# Patient Record
Sex: Male | Born: 1955 | Race: White | Hispanic: No | Marital: Married | Smoking: Never smoker
Health system: Southern US, Community
[De-identification: ages and names within clinical notes are randomized; demographics above are authoritative.]

## PROBLEM LIST (undated history)

## (undated) DIAGNOSIS — T7840XA Allergy, unspecified, initial encounter: Secondary | ICD-10-CM

## (undated) DIAGNOSIS — R972 Elevated prostate specific antigen [PSA]: Secondary | ICD-10-CM

## (undated) DIAGNOSIS — R7303 Prediabetes: Secondary | ICD-10-CM

## (undated) DIAGNOSIS — N4340 Spermatocele of epididymis, unspecified: Secondary | ICD-10-CM

## (undated) DIAGNOSIS — N400 Enlarged prostate without lower urinary tract symptoms: Secondary | ICD-10-CM

## (undated) DIAGNOSIS — T8859XA Other complications of anesthesia, initial encounter: Secondary | ICD-10-CM

## (undated) DIAGNOSIS — I10 Essential (primary) hypertension: Secondary | ICD-10-CM

## (undated) DIAGNOSIS — N529 Male erectile dysfunction, unspecified: Secondary | ICD-10-CM

## (undated) HISTORY — DX: Elevated prostate specific antigen (PSA): R97.20

## (undated) HISTORY — DX: Allergy, unspecified, initial encounter: T78.40XA

## (undated) HISTORY — PX: COLONOSCOPY: SHX174

## (undated) HISTORY — DX: Spermatocele of epididymis, unspecified: N43.40

## (undated) HISTORY — DX: Male erectile dysfunction, unspecified: N52.9

---

## 2013-07-18 ENCOUNTER — Other Ambulatory Visit: Payer: Self-pay

## 2013-07-18 ENCOUNTER — Ambulatory Visit (INDEPENDENT_AMBULATORY_CARE_PROVIDER_SITE_OTHER): Payer: BC Managed Care – PPO | Admitting: Family Medicine

## 2013-07-18 VITALS — BP 130/80 | HR 79 | Temp 98.0°F | Resp 17 | Ht 73.0 in | Wt 203.0 lb

## 2013-07-18 DIAGNOSIS — N529 Male erectile dysfunction, unspecified: Secondary | ICD-10-CM | POA: Insufficient documentation

## 2013-07-18 DIAGNOSIS — N5089 Other specified disorders of the male genital organs: Secondary | ICD-10-CM

## 2013-07-18 DIAGNOSIS — Z0000 Encounter for general adult medical examination without abnormal findings: Secondary | ICD-10-CM

## 2013-07-18 DIAGNOSIS — N508 Other specified disorders of male genital organs: Secondary | ICD-10-CM

## 2013-07-18 DIAGNOSIS — E785 Hyperlipidemia, unspecified: Secondary | ICD-10-CM

## 2013-07-18 DIAGNOSIS — R5381 Other malaise: Secondary | ICD-10-CM

## 2013-07-18 DIAGNOSIS — R972 Elevated prostate specific antigen [PSA]: Secondary | ICD-10-CM

## 2013-07-18 LAB — COMPREHENSIVE METABOLIC PANEL
ALT: 37 U/L (ref 0–53)
CO2: 24 mEq/L (ref 19–32)
Calcium: 9.1 mg/dL (ref 8.4–10.5)
Chloride: 101 mEq/L (ref 96–112)
Creat: 0.92 mg/dL (ref 0.50–1.35)
Glucose, Bld: 96 mg/dL (ref 70–99)
Sodium: 136 mEq/L (ref 135–145)
Total Protein: 6.9 g/dL (ref 6.0–8.3)

## 2013-07-18 LAB — POCT CBC
Granulocyte percent: 54.5 %G (ref 37–80)
HCT, POC: 46.1 % (ref 43.5–53.7)
Hemoglobin: 14.8 g/dL (ref 14.1–18.1)
Lymph, poc: 2.2 (ref 0.6–3.4)
MCH, POC: 31.2 pg (ref 27–31.2)
MCHC: 32.1 g/dL (ref 31.8–35.4)
MPV: 8.5 fL (ref 0–99.8)
POC MID %: 7.7 %M (ref 0–12)
RBC: 4.74 M/uL (ref 4.69–6.13)
WBC: 5.7 10*3/uL (ref 4.6–10.2)

## 2013-07-18 LAB — TESTOSTERONE: Testosterone: 375 ng/dL (ref 300–890)

## 2013-07-18 LAB — TSH: TSH: 1.205 u[IU]/mL (ref 0.350–4.500)

## 2013-07-18 LAB — LIPID PANEL
HDL: 35 mg/dL — ABNORMAL LOW (ref >39)
LDL Cholesterol: 145 mg/dL — ABNORMAL HIGH (ref 0–99)
VLDL: 23 mg/dL (ref 0–40)

## 2013-07-18 LAB — PSA: PSA: 5.76 ng/mL — ABNORMAL HIGH (ref <=4.00)

## 2013-07-18 MED ORDER — TADALAFIL 5 MG PO TABS: ORAL_TABLET | ORAL | Status: DC

## 2013-07-18 NOTE — Patient Instructions (Signed)
I will be in touch with you regarding your labs in the next few days.  Please set up your mychart account; this makes communication much quicker!    We will contact you regarding your testicular ultrasound.  If you do not hear anything in a week or so please let me know.  Please think about scheduling a screening colonoscopy in the next few months .  [facility] GI division.

## 2013-07-18 NOTE — Progress Notes (Addendum)
[facility] [address] [place] [address] [phone]  Date:  07/18/2013   Name:  Shane Patterson   DOB:  1956-01-19   MRN:  [id number]  PCP:  No PCP Per Patient    Chief Complaint: Annual Exam   History of Present Illness:  Shane Patterson is a 57 y.o. very pleasant male patient who presents with the following:  Generally healthy gentleman here as a new pt for a CPE today.  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   He does not use tobacco, does not use any drugs.  Drinks lightly.   He does exercise regualalry at the gym.  He has noted that his lifting ability has seemed to drop off over the last year or two.  He wonders if anything is wrong or if he is just getting a little bit older He is fasting today He does take cialis as needed.  He takes 1/2 of the 5mg  tabs on a prn basis, a few times a week  Tetanus shot less than 10 years ago, flu shot last month.  He has not yet had a colonoscopy  Also admits that he has noted a small bump on his right testicle for about 8 years.  He is not sure if this is anything to be concerned about  There are no active problems to display for this patient.   History reviewed. No pertinent past medical history.  History reviewed. No pertinent past surgical history.  History  Substance Use Topics  . Smoking status: Never Smoker   . Smokeless tobacco: Not on file  . Alcohol Use: No    Family History  Problem Relation Age of Onset  . Diabetes Father     No Known Allergies  Medication list has been reviewed and updated.  No current outpatient prescriptions on file prior to visit.   No current facility-administered medications on file prior to visit.    Review of Systems:  As per HPI- otherwise negative.   Physical Examination: Filed Vitals:   07/18/13 0808  BP: 130/80  Pulse: 79  Temp: 98 F (36.7 C)  Resp: 17   Filed Vitals:   07/18/13 0808  Height: 6\' 1"  (1.854 m)  Weight: 203 lb (92.08  kg)   Body mass index is 26.79 kg/(m^2). Ideal Body Weight: Weight in (lb) to have BMI = 25: 189.1  GEN: WDWN, NAD, Non-toxic, A & O x 3, looks well, fit build HEENT: Atraumatic, Normocephalic. Neck supple. No masses, No LAD.  Bilateral TM wnl, oropharynx normal.  PEERL,EOMI.   Ears and Nose: No external deformity. CV: RRR, No M/G/R. No JVD. No thrill. No extra heart sounds. PULM: CTA B, no wheezes, crackles, rhonchi. No retractions. No resp. distress. No accessory muscle use. ABD: S, NT, ND, +BS. No rebound. No HSM. EXTR: No c/c/e NEURO Normal gait.  PSYCH: Normally interactive. Conversant. Not depressed or anxious appearing.  Calm demeanor.  GU: prostate smooth and normal.  He notes a difference in his right testicle.  On exam the right testicle does seem more full at the superior pole.  No nodules or hard spots. No penile discharge or lesions   Assessment and Plan: Physical exam - Plan: POCT CBC, Comprehensive metabolic panel, PSA, Lipid panel  Other malaise and fatigue - Plan: TSH, Testosterone  Erectile dysfunction - Plan: tadalafil (CIALIS) 5 MG tablet, DISCONTINUED: tadalafil (CIALIS) 5 MG tablet  Testicular mass - Plan: US Scrotum  Labs pending as above.   Refilled his cialis.   Plan ultrasound to evaluate scrotal mass.   Encouraged him to get a colonoscopy  Signed Abbe Amsterdam, MD  11/24: received labs and gave him a call.  Went over labs and also spoke with his GF per his request.  cholesterol ratio is not as good as we would like.  Will start a low dose of statin medication. Please come for lab visit only in 1-2 months for liver function only. PSA is a little high.  Not to panic, but will refer to urology for evaluation. Otherwise labs look good.    Results for orders placed in visit on 07/18/13  COMPREHENSIVE METABOLIC PANEL      Result Value Range   Sodium 136  135 - 145 mEq/L   Potassium 4.2  3.5 - 5.3 mEq/L   Chloride 101  96 - 112 mEq/L   CO2 24  19 - 32  mEq/L   Glucose, Bld 96  70 - 99 mg/dL   BUN 16  6 - 23 mg/dL   Creat 1.61  0.96 - 0.45 mg/dL   Total Bilirubin 0.5  0.3 - 1.2 mg/dL   Alkaline Phosphatase 85  39 - 117 U/L   AST 29  0 - 37 U/L   ALT 37  0 - 53 U/L   Total Protein 6.9  6.0 - 8.3 g/dL   Albumin 4.4  3.5 - 5.2 g/dL   Calcium 9.1  8.4 - 40.9 mg/dL  TSH      Result Value Range   TSH 1.205  0.350 - 4.500 uIU/mL  PSA      Result Value Range   PSA 5.76 (*) <=4.00 ng/mL  LIPID PANEL      Result Value Range   Cholesterol 203 (*) 0 - 200 mg/dL   Triglycerides 811  <914 mg/dL   HDL 35 (*) >78 mg/dL   Total CHOL/HDL Ratio 5.8     VLDL 23  0 - 40 mg/dL   LDL Cholesterol 295 (*) 0 - 99 mg/dL  TESTOSTERONE      Result Value Range   Testosterone 375  300 - 890 ng/dL  POCT CBC      Result Value Range   WBC 5.7  4.6 - 10.2 K/uL   Lymph, poc 2.2  0.6 - 3.4   POC LYMPH PERCENT 37.8  10 - 50 %L   MID (cbc) 0.4  0 - 0.9   POC MID % 7.7  0 - 12 %M   POC Granulocyte 3.1  2 - 6.9   Granulocyte percent 54.5  37 - 80 %G   RBC 4.74  4.69 - 6.13 M/uL   Hemoglobin 14.8  14.1 - 18.1 g/dL   HCT, POC 62.1  30.8 - 53.7 %   MCV 97.3 (*) 80 - 97 fL   MCH, POC 31.2  27 - 31.2 pg   MCHC 32.1  31.8 - 35.4 g/dL   RDW, POC 65.7     Platelet Count, POC 244  142 - 424 K/uL   MPV 8.5  0 - 99.8 fL

## 2013-07-21 ENCOUNTER — Encounter: Payer: Self-pay | Admitting: Family Medicine

## 2013-07-21 MED ORDER — LOVASTATIN 20 MG PO TABS: 20.0000 mg | ORAL_TABLET | Freq: Every day | ORAL | Status: DC

## 2013-07-21 NOTE — Addendum Note (Signed)
Addended by: Abbe Amsterdam C on: 07/21/2013 07:12 PM   Modules accepted: Orders

## 2013-08-04 ENCOUNTER — Ambulatory Visit (INDEPENDENT_AMBULATORY_CARE_PROVIDER_SITE_OTHER): Payer: BC Managed Care – PPO | Admitting: Family Medicine

## 2013-08-04 VITALS — BP 148/100 | HR 98 | Temp 98.2°F | Resp 18 | Ht 73.0 in | Wt 207.0 lb

## 2013-08-04 DIAGNOSIS — E7800 Pure hypercholesterolemia, unspecified: Secondary | ICD-10-CM

## 2013-08-04 DIAGNOSIS — J019 Acute sinusitis, unspecified: Secondary | ICD-10-CM

## 2013-08-04 MED ORDER — AMOXICILLIN 500 MG PO CAPS: 1000.0000 mg | ORAL_CAPSULE | Freq: Two times a day (BID) | ORAL | Status: DC

## 2013-08-04 NOTE — Progress Notes (Signed)
[facility] [address] [place] [address] [phone]  Date:  08/04/2013   Name:  Shane Patterson   DOB:  01-20-1956   MRN:  [id number]  PCP:  No PCP Per Patient    Chief Complaint: Sinusitis, labs and diet   History of Present Illness:  Shane Patterson is a 57 y.o. very pleasant male patient who presents with the following:  He is here today with illness and a couple of other concerns.  About 2 weeks ago he noted a URI. He tried zycam.  The cold sx are better, but he now has sinus pressure and pain, discolored nasal mucus.  Yesterday he had a temp of 100.  He feels that he has a sinus infection which he has had in the past.   He has just an occasional cough.  No ST at this time.  He feels pressure most on the left side of his face and head, and his left cervical glands are swollen.   No GI symptoms.   He did have coffee and a BC today.  He notes that his BP tends to be higher in the morning.  He does follow it at home and notes that it is usually about 130/ 80  He decided not to use the cholesterol medication that we discussed on the phone; he would prefer to try changing his diet and working on more cardio exercise.  He plans to increase his fiber and add omega 3s, eat more fish, etc.    Patient Active Problem List   Diagnosis Date Noted  . Erectile dysfunction 07/18/2013    No past medical history on file.  No past surgical history on file.  History  Substance Use Topics  . Smoking status: Never Smoker   . Smokeless tobacco: Not on file  . Alcohol Use: No    Family History  Problem Relation Age of Onset  . Diabetes Father     No Known Allergies  Medication list has been reviewed and updated.  Current Outpatient Prescriptions on File Prior to Visit  Medication Sig Dispense Refill  . tadalafil (CIALIS) 5 MG tablet Take 1/2 or 1 tablet daily as needed  10 tablet  11  . lovastatin (MEVACOR) 20 MG tablet Take 1 tablet (20 mg total) by mouth at  bedtime.  90 tablet  3   No current facility-administered medications on file prior to visit.    Review of Systems:  As per HPI- otherwise negative.   Physical Examination: Filed Vitals:   08/04/13 0906  BP: 148/100  Pulse: 98  Temp: 98.2 F (36.8 C)  Resp: 18   Filed Vitals:   08/04/13 0906  Height: 6\' 1"  (1.854 m)  Weight: 207 lb (93.895 kg)   Body mass index is 27.32 kg/(m^2). Ideal Body Weight: Weight in (lb) to have BMI = 25: 189.1  GEN: WDWN, NAD, Non-toxic, A & O x 3, looks well HEENT: Atraumatic, Normocephalic. Neck supple. No masses, No LAD.  Bilateral TM wnl, oropharynx normal.  PEERL,EOMI.   Nasal cavity is inflanmed with purulent discharge on the left.  Left sided sinuses tender to percussion Ears and Nose: No external deformity. CV: RRR, No M/G/R. No JVD. No thrill. No extra heart sounds. PULM: CTA B, no wheezes, crackles, rhonchi. No retractions. No resp. distress. No accessory muscle use. EXTR: No c/c/e NEURO Normal gait.  PSYCH: Normally interactive. Conversant. Not depressed or anxious appearing.  Calm demeanor.   Assessment and Plan:  Sinusitis, acute - Plan: amoxicillin (AMOXIL) 500 MG capsule  High cholesterol  Treat sinusitis with amox.  He would prefer to try diet and lifestyle changes prior to medication which is reasonable.  Plan to recheck his CHL in about 6 months.  He will follow his BP at home and let me know if persistently high   Signed Abbe Amsterdam, MD

## 2013-08-04 NOTE — Patient Instructions (Signed)
Use the amoxicillin as directed.  If you are not feeling better in the next few day please let me know- Sooner if worse.   Let's plan to recheck your cholesterol in 4- 6 months.   Work on the lifestyle changes you brought up to help improve your cholesterol.   I will check on your urology referral but let me know if you do not hear about this soon.

## 2013-08-05 ENCOUNTER — Ambulatory Visit: Payer: Self-pay | Admitting: Family Medicine

## 2013-08-07 ENCOUNTER — Telehealth: Payer: Self-pay

## 2013-08-07 DIAGNOSIS — J019 Acute sinusitis, unspecified: Secondary | ICD-10-CM

## 2013-08-07 MED ORDER — LEVOFLOXACIN 500 MG PO TABS: 500.0000 mg | ORAL_TABLET | Freq: Every day | ORAL | Status: DC

## 2013-08-07 NOTE — Telephone Encounter (Signed)
Patient states the medication is not working and would like another antibiotic.  [facility] [employer]   [phone]

## 2013-08-07 NOTE — Telephone Encounter (Signed)
Called and discussed with him.  He continues to have a lot of painful sinus pressure and nasal discharge.  Also some cough- no fever.  Will change to levaquin.  Recommended that he take a probiotic to help prevent diarrhea.  He will keep me posted

## 2013-08-11 ENCOUNTER — Ambulatory Visit: Payer: Self-pay | Admitting: Internal Medicine

## 2013-08-13 ENCOUNTER — Telehealth: Payer: Self-pay

## 2013-08-13 NOTE — Telephone Encounter (Signed)
The Levaquin will continue to work, although he has completed the course. He should use Mucinex and Flonase for the congestion. Left message for him to call me back.

## 2013-08-13 NOTE — Telephone Encounter (Signed)
PT STATES THAT HE WAS SEEN IN OUR OFFICE FOR A SINUS INFECTION, PT STATES THAT HE IS STILL HAVING (YELLOW) DISCHARGE FROM HIS NOSE (HIS GLANDS ARE NO LONGER SWOLLEN). PT WOULD LIKE TO KNOW IF HE WILL NEED TO HAVE ANOTHER ANTIBIOTIC FOR THIS. BEST # [phone]

## 2013-08-13 NOTE — Telephone Encounter (Signed)
Pt.notified

## 2013-08-26 ENCOUNTER — Encounter: Payer: Self-pay | Admitting: Internal Medicine

## 2014-03-18 ENCOUNTER — Encounter: Payer: Self-pay | Admitting: Family Medicine

## 2014-03-18 DIAGNOSIS — N4340 Spermatocele of epididymis, unspecified: Secondary | ICD-10-CM | POA: Insufficient documentation

## 2014-03-18 DIAGNOSIS — R972 Elevated prostate specific antigen [PSA]: Secondary | ICD-10-CM | POA: Insufficient documentation

## 2014-07-27 ENCOUNTER — Other Ambulatory Visit: Payer: Self-pay | Admitting: Family Medicine

## 2014-07-27 ENCOUNTER — Ambulatory Visit (INDEPENDENT_AMBULATORY_CARE_PROVIDER_SITE_OTHER): Payer: BC Managed Care – PPO | Admitting: Family Medicine

## 2014-07-27 ENCOUNTER — Encounter: Payer: Self-pay | Admitting: Family Medicine

## 2014-07-27 VITALS — BP 150/90 | HR 101 | Temp 97.7°F | Resp 16 | Ht 73.5 in | Wt 202.6 lb

## 2014-07-27 DIAGNOSIS — R972 Elevated prostate specific antigen [PSA]: Secondary | ICD-10-CM

## 2014-07-27 DIAGNOSIS — E7800 Pure hypercholesterolemia, unspecified: Secondary | ICD-10-CM

## 2014-07-27 DIAGNOSIS — Z0000 Encounter for general adult medical examination without abnormal findings: Secondary | ICD-10-CM

## 2014-07-27 DIAGNOSIS — Z23 Encounter for immunization: Secondary | ICD-10-CM

## 2014-07-27 DIAGNOSIS — N529 Male erectile dysfunction, unspecified: Secondary | ICD-10-CM

## 2014-07-27 DIAGNOSIS — I10 Essential (primary) hypertension: Secondary | ICD-10-CM

## 2014-07-27 LAB — COMPLETE METABOLIC PANEL WITH GFR
ALT: 26 U/L (ref 0–53)
AST: 23 U/L (ref 0–37)
Albumin: 4.7 g/dL (ref 3.5–5.2)
Alkaline Phosphatase: 93 U/L (ref 39–117)
BUN: 16 mg/dL (ref 6–23)
CALCIUM: 9.9 mg/dL (ref 8.4–10.5)
CHLORIDE: 100 meq/L (ref 96–112)
CO2: 26 meq/L (ref 19–32)
CREATININE: 0.93 mg/dL (ref 0.50–1.35)
GFR, Est Non African American: >89 mL/min
Glucose, Bld: 93 mg/dL (ref 70–99)
Potassium: 4.7 mEq/L (ref 3.5–5.3)
Sodium: 137 mEq/L (ref 135–145)
Total Bilirubin: 0.4 mg/dL (ref 0.2–1.2)
Total Protein: 7.3 g/dL (ref 6.0–8.3)

## 2014-07-27 LAB — CBC WITH DIFFERENTIAL/PLATELET
Basophils Absolute: 0.1 10*3/uL (ref 0.0–0.1)
Basophils Relative: 1 % (ref 0–1)
EOS PCT: 2 % (ref 0–5)
Eosinophils Absolute: 0.1 10*3/uL (ref 0.0–0.7)
HCT: 42.3 % (ref 39.0–52.0)
Hemoglobin: 15 g/dL (ref 13.0–17.0)
LYMPHS ABS: 1.8 10*3/uL (ref 0.7–4.0)
Lymphocytes Relative: 24 % (ref 12–46)
MCH: 31.5 pg (ref 26.0–34.0)
MCHC: 35.5 g/dL (ref 30.0–36.0)
MCV: 88.9 fL (ref 78.0–100.0)
MONOS PCT: 7 % (ref 3–12)
MPV: 9.8 fL (ref 9.4–12.4)
Monocytes Absolute: 0.5 10*3/uL (ref 0.1–1.0)
Neutro Abs: 4.8 10*3/uL (ref 1.7–7.7)
Neutrophils Relative %: 66 % (ref 43–77)
PLATELETS: 258 10*3/uL (ref 150–400)
RBC: 4.76 MIL/uL (ref 4.22–5.81)
RDW: 13 % (ref 11.5–15.5)
WBC: 7.3 10*3/uL (ref 4.0–10.5)

## 2014-07-27 LAB — POCT URINALYSIS DIPSTICK
BILIRUBIN UA: NEGATIVE
Blood, UA: NEGATIVE
Glucose, UA: NEGATIVE
KETONES UA: NEGATIVE
Leukocytes, UA: NEGATIVE
Nitrite, UA: NEGATIVE
PROTEIN UA: NEGATIVE
Urobilinogen, UA: 0.2
pH, UA: 6

## 2014-07-27 MED ORDER — HYDROCHLOROTHIAZIDE 25 MG PO TABS: 25.0000 mg | ORAL_TABLET | Freq: Every day | ORAL | Status: DC

## 2014-07-27 MED ORDER — TADALAFIL 5 MG PO TABS: ORAL_TABLET | ORAL | Status: DC

## 2014-07-27 NOTE — Patient Instructions (Signed)

## 2014-07-27 NOTE — Progress Notes (Signed)
Subjective:    Patient ID: Shane Patterson, male    DOB: 11-07-1955, 58 y.o.   MRN: [id number]  HPI This is a 58 yo patient who presents today for CPE. Last year, he was found to have an elevated PSA. He was seen by Dr. Isabel CapriceGrapey, [facility], and prostate biopsy was done and found to be negative. His PSA on recheck was 14, but decreased to 8 a couple of months later. He was last seen by Dr. Isabel CapriceGrapey 7/15. He does not believe he has any follow up scheduled.   Last CPE- last year.  Colonoscopy- has never had, is not interested in scheduling at this time  Tdap- thinks within last 4-5 years Flu- today Dental- not in several years, brushes and flosses daily Eye- basic screening this fall Exercise- regular  ED- patient with a history of ED for several years. Testosterone level low normal. Has had best results with cialis 5 mg po several times a week. Also notes that blood pressure is better controlled when he is on regular cialis. He had switched to sildenafil, but was not as satisfied with the results and felt that his blood pressure was higher.   Elevated BP- it has been noted on multiple occasions. He checks at home and notices that it runs 150s/90s consistently in the morning and is lower in the afternoon.   Review of Systems  Constitutional: Negative.   HENT: Negative.   Eyes: Negative.   Respiratory: Negative.   Cardiovascular: Negative.   Gastrointestinal: Negative.   Endocrine: Negative.   Genitourinary: Positive for scrotal swelling (history of spermatocele, unchanged for several years).  Musculoskeletal: Negative.   Skin: Negative.   Allergic/Immunologic: Negative.   Hematological: Negative.   Psychiatric/Behavioral: Negative.       Objective:   Physical Exam  Constitutional: He is oriented to person, place, and time. He appears well-developed and well-nourished. No distress.  Very toned and muscular.  HENT:  Head: Normocephalic and atraumatic.  Right Ear: External ear  normal.  Left Ear: External ear normal.  Mouth/Throat: Oropharynx is clear and moist.  Eyes: Conjunctivae are normal. Pupils are equal, round, and reactive to light.  Neck: Normal range of motion. Neck supple.  Cardiovascular: Normal rate, regular rhythm, normal heart sounds and intact distal pulses.   Pulmonary/Chest: Effort normal and breath sounds normal.  Abdominal: Soft. Bowel sounds are normal. Hernia confirmed negative in the right inguinal area and confirmed negative in the left inguinal area.  Genitourinary: Rectum normal, prostate normal and penis normal.    Right testis shows swelling. Right testis shows no mass and no tenderness. Right testis is descended. Cremasteric reflex is not absent on the right side. Left testis shows no mass, no swelling and no tenderness. Left testis is descended. Cremasteric reflex is not absent on the left side. Circumcised. No penile tenderness.  Musculoskeletal: Normal range of motion.  Lymphadenopathy:    He has no cervical adenopathy.       Right: No inguinal adenopathy present.       Left: No inguinal adenopathy present.  Neurological: He is alert and oriented to person, place, and time. He has normal reflexes.  Skin: Skin is warm and dry. He is not diaphoretic.  Psychiatric: He has a normal mood and affect. His behavior is normal. Judgment and thought content normal.  Vitals reviewed.  BP 150/90 mmHg  Pulse 101  Temp(Src) 97.7 F (36.5 C) (Oral)  Resp 16  Ht 6' 1.5" (1.867 m)  Wt 202  lb 9.6 oz (91.899 kg)  BMI 26.36 kg/m2  SpO2 96%    Assessment & Plan:  1. Routine general medical examination at a health care facility - patient not interested in screening colonoscopy at this time, has agreed to do home Hemosure.   2. Essential hypertension - hydrochlorothiazide (HYDRODIURIL) 25 MG tablet; Take 1 tablet (25 mg total) by mouth daily.  Dispense: 90 tablet; Refill: 1 - POCT urinalysis dipstick - CBC with Differential - COMPLETE  METABOLIC PANEL WITH GFR - He will check blood pressures at home and keep a log, he will have BMP in 4 weeks as well as BP check and bring readings.  - Follow up in 3 months.   3. Erectile dysfunction, unspecified erectile dysfunction type - tadalafil (CIALIS) 5 MG tablet; Take 1/2 or 1 tablet daily as needed  Dispense: 30 tablet; Refill: 2 - PSA  4. Elevated PSA - Patient to follow up with Dr. Isabel Caprice at [facility]  5. Need for prophylactic vaccination and inoculation against influenza - Flu Vaccine QUAD 36+ mos IM   Emi Belfast, FNP-BC  [facility]  07/28/2014 8:32 PM

## 2014-07-28 ENCOUNTER — Encounter: Payer: Self-pay | Admitting: Family Medicine

## 2014-07-28 LAB — PSA: PSA: 9.26 ng/mL — ABNORMAL HIGH (ref <=4.00)

## 2014-07-29 ENCOUNTER — Telehealth: Payer: Self-pay | Admitting: Family Medicine

## 2014-07-29 NOTE — Addendum Note (Signed)
Addended by: Olean ReeGESSNER, Rigby Leonhardt B on: 07/29/2014 05:05 PM   Modules accepted: Orders, SmartSet

## 2014-07-29 NOTE — Telephone Encounter (Signed)
Patient called concerned about his psa levels and the color of his semen.  He stated that he had a biopsy done in the summer of 2015 and feels as though the urologist is dismissing his concerns and doesn't want to answer his questions.  He would like to know if there is some other test that can be done to see if there is infection with the psa count going up.

## 2014-07-30 LAB — LIPID PANEL
CHOLESTEROL: 195 mg/dL (ref 0–200)
HDL: 35 mg/dL — ABNORMAL LOW (ref >39)
LDL Cholesterol: 104 mg/dL — ABNORMAL HIGH (ref 0–99)
Total CHOL/HDL Ratio: 5.6 Ratio
Triglycerides: 278 mg/dL — ABNORMAL HIGH (ref <150)
VLDL: 56 mg/dL — AB (ref 0–40)

## 2014-07-31 LAB — IFOBT (OCCULT BLOOD): IMMUNOLOGICAL FECAL OCCULT BLOOD TEST: POSITIVE

## 2014-07-31 NOTE — Addendum Note (Signed)
Addended byLevon Hedger A on: 07/31/2014 12:40 PM   Modules accepted: Orders

## 2014-08-03 ENCOUNTER — Telehealth: Payer: Self-pay | Admitting: Family Medicine

## 2014-08-03 NOTE — Telephone Encounter (Signed)
Attempted to call patient to notify him of positive IFOBT and lipid results. No answer.

## 2014-08-04 ENCOUNTER — Other Ambulatory Visit: Payer: Self-pay | Admitting: Family Medicine

## 2014-08-04 ENCOUNTER — Encounter: Payer: Self-pay | Admitting: Gastroenterology

## 2014-08-04 DIAGNOSIS — E785 Hyperlipidemia, unspecified: Secondary | ICD-10-CM

## 2014-08-04 DIAGNOSIS — R195 Other fecal abnormalities: Secondary | ICD-10-CM

## 2014-08-12 ENCOUNTER — Ambulatory Visit: Payer: Self-pay | Admitting: Gastroenterology

## 2014-08-17 ENCOUNTER — Ambulatory Visit (INDEPENDENT_AMBULATORY_CARE_PROVIDER_SITE_OTHER): Payer: BC Managed Care – PPO | Admitting: Emergency Medicine

## 2014-08-17 VITALS — BP 157/108 | HR 84 | Temp 97.9°F | Resp 18 | Wt 204.0 lb

## 2014-08-17 DIAGNOSIS — J0140 Acute pansinusitis, unspecified: Secondary | ICD-10-CM

## 2014-08-17 DIAGNOSIS — I10 Essential (primary) hypertension: Secondary | ICD-10-CM

## 2014-08-17 DIAGNOSIS — J209 Acute bronchitis, unspecified: Secondary | ICD-10-CM

## 2014-08-17 MED ORDER — HYDROCOD POLST-CHLORPHEN POLST 10-8 MG/5ML PO LQCR: 5.0000 mL | Freq: Two times a day (BID) | ORAL | Status: DC | PRN

## 2014-08-17 MED ORDER — AMOXICILLIN-POT CLAVULANATE 875-125 MG PO TABS: 1.0000 | ORAL_TABLET | Freq: Two times a day (BID) | ORAL | Status: DC

## 2014-08-17 NOTE — Progress Notes (Signed)
[facility] [address], [place] [address] [phone]  Date:  08/17/2014   Name:  Shane Patterson   DOB:  1955-10-24   MRN:  [id number]  PCP:  No PCP Per Patient    Chief Complaint: Sinusitis and URI   History of Present Illness:  Normand Doll is a 58 y.o. very pleasant male patient who presents with the following:  Given prescription for hypertension 3 weeks ago and has not started it as of yet. Has nasal congestion and post nasal purulent drainage and nasal discharge Sore throat, pressure in sinuses Cough productive of purulent sputum Occasional wheezing no shortness of breath No fever or chills. No nausea or vomiting No rash or stool change No improvement with over the counter medications or other home remedies.  Denies other complaint or health concern today.   Patient Active Problem List   Diagnosis Date Noted  . Elevated PSA 03/18/2014  . 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 Erectile dysfunction 07/18/2013    No past medical history on file.  No past surgical history on file.  History  Substance Use Topics  . Smoking status: Never Smoker   . Smokeless tobacco: Not on file  . Alcohol Use: No    Family History  Problem Relation Age of Onset  . Diabetes Father     No Known Allergies  Medication list has been reviewed and updated.  Current Outpatient Prescriptions on File Prior to Visit  Medication Sig Dispense Refill  . hydrochlorothiazide (HYDRODIURIL) 25 MG tablet Take 1 tablet (25 mg total) by mouth daily. (Patient not taking: Reported on 08/17/2014) 90 tablet 1  . tadalafil (CIALIS) 5 MG tablet Take 1/2 or 1 tablet daily as needed (Patient not taking: Reported on 08/17/2014) 30 tablet 2   No current facility-administered medications on file prior to visit.    Review of Systems:  As per HPI, otherwise negative.    Physical Examination: Filed Vitals:   08/17/14 1237  BP: 157/108  Pulse: 84  Temp: 97.9 F (36.6 C)  Resp: 18    Filed Vitals:   08/17/14 1237  Weight: 204 lb (92.534 kg)   Body mass index is 26.55 kg/(m^2). Ideal Body Weight:    GEN: WDWN, NAD, Non-toxic, A & O x 3 HEENT: Atraumatic, Normocephalic. Neck supple. No masses, No LAD. Ears and Nose: No external deformity. CV: RRR, No M/G/R. No JVD. No thrill. No extra heart sounds. PULM: CTA B, no wheezes, crackles, rhonchi. No retractions. No resp. distress. No accessory muscle use. ABD: S, NT, ND, +BS. No rebound. No HSM. EXTR: No c/c/e NEURO Normal gait.  PSYCH: Normally interactive. Conversant. Not depressed or anxious appearing.  Calm demeanor.    Assessment and Plan: Sinusitis Bronchitis augmentin tussionex  Signed,  Phillips Odor, MD

## 2014-08-17 NOTE — Patient Instructions (Signed)

## 2014-08-18 ENCOUNTER — Other Ambulatory Visit: Payer: Self-pay | Admitting: Physician Assistant

## 2014-08-18 ENCOUNTER — Telehealth: Payer: Self-pay | Admitting: Physician Assistant

## 2014-08-18 ENCOUNTER — Telehealth: Payer: Self-pay

## 2014-08-18 DIAGNOSIS — J329 Chronic sinusitis, unspecified: Secondary | ICD-10-CM

## 2014-08-18 MED ORDER — AMOXICILLIN 875 MG PO TABS: 875.0000 mg | ORAL_TABLET | Freq: Two times a day (BID) | ORAL | Status: DC

## 2014-08-18 NOTE — Telephone Encounter (Signed)
Pt states the antibiotic he was given is making him feel much worse, doesn't want to come back in but would like to have something else called in. Please call [phone]     [facility]

## 2014-08-18 NOTE — Telephone Encounter (Signed)
Spoke with patient.  He is not tolerating the augmentin.  Advised patient to take the levaquin to treat both sinusitis and bronchitis.  Patient became disgruntled, stating that he has taken amoxillin every time he has had the bronchitis.    Much counseling given about antibiotic efficacy with given illnesses.  He is apprehensive to any other choice.  Ordered amoxicillin 875 bid for 10 days. He will rtc if symptoms do not improve.

## 2014-08-18 NOTE — Telephone Encounter (Signed)
Pt is taking Augmentin- making him feel nauseated and has a headache. He has had some loose stool.  He would like to take Amoxacillin instead. He has not issues with this. Advised pt to stop medication. Please advise change in medication.

## 2014-08-18 NOTE — Telephone Encounter (Signed)
Patient states it has been more than five hours and he needs his medication called in now.

## 2014-08-18 NOTE — Telephone Encounter (Signed)
Patient says we did now know the telephone number to his pharmacy [facility] [phone]

## 2014-08-18 NOTE — Telephone Encounter (Signed)
This is a new pharmacy that is not listed in our system. It will need to be called into them- [phone] Please advise. Pt has called numerous times today.

## 2014-09-30 ENCOUNTER — Ambulatory Visit: Payer: Self-pay | Admitting: Gastroenterology

## 2014-11-16 ENCOUNTER — Ambulatory Visit: Payer: Self-pay | Admitting: Gastroenterology

## 2014-12-02 ENCOUNTER — Telehealth: Payer: Self-pay

## 2014-12-02 DIAGNOSIS — N529 Male erectile dysfunction, unspecified: Secondary | ICD-10-CM

## 2014-12-02 NOTE — Telephone Encounter (Signed)
Pt would like a hard copy of his tadalafil (CIALIS) 5 MG tablet [[id number]] mailed to him at [address], [address], [address]. He can get this script a lot cheaper through [facility] and they need a hard copy. He would also like to know why he only has 2 refills remaining. He said it use to be for a year. Please advise at [phone]

## 2014-12-02 NOTE — Telephone Encounter (Signed)
Can we give pt hardcopy? Also, Dr Katrinka Blazing gave him 11 refills before, but Shane Patterson only gave him 2 refills. Is there any way to give him more refills?

## 2014-12-03 MED ORDER — TADALAFIL 5 MG PO TABS: ORAL_TABLET | ORAL | Status: DC

## 2014-12-03 NOTE — Telephone Encounter (Signed)
Pt.notified

## 2014-12-03 NOTE — Telephone Encounter (Signed)
I have printed the script and is at [facility]. I gave him 5 refills. This should get him through the year as he stated at last appt that he is only using it a few times per week.

## 2014-12-29 ENCOUNTER — Encounter: Payer: Self-pay | Admitting: Internal Medicine

## 2015-01-05 ENCOUNTER — Ambulatory Visit: Payer: Self-pay | Admitting: Gastroenterology

## 2015-02-24 ENCOUNTER — Ambulatory Visit: Payer: Self-pay | Admitting: Internal Medicine

## 2015-07-21 ENCOUNTER — Telehealth: Payer: Self-pay

## 2015-07-21 NOTE — Telephone Encounter (Signed)
He should be seen.....but it is ok to stop

## 2015-07-21 NOTE — Telephone Encounter (Signed)
Patient is calling because he's been taking hydrochlorothiazide and is experiencing side effects of dizziness, back pain, frequent urination and blurred vision. Patient states he been taking it for two weeks and wants to know if it's okay to stop until his appointment next week. Please advise!

## 2015-07-23 NOTE — Telephone Encounter (Signed)
Left message for pt to call back  °

## 2015-07-24 NOTE — Telephone Encounter (Signed)
Left detailes message on pt's voicemail.

## 2015-07-28 ENCOUNTER — Ambulatory Visit (INDEPENDENT_AMBULATORY_CARE_PROVIDER_SITE_OTHER): Payer: BLUE CROSS/BLUE SHIELD | Admitting: Family Medicine

## 2015-07-28 ENCOUNTER — Encounter: Payer: Self-pay | Admitting: Family Medicine

## 2015-07-28 VITALS — BP 140/98 | HR 97 | Temp 98.0°F | Resp 16 | Ht 73.0 in | Wt 199.8 lb

## 2015-07-28 DIAGNOSIS — I10 Essential (primary) hypertension: Secondary | ICD-10-CM | POA: Diagnosis not present

## 2015-07-28 DIAGNOSIS — Z23 Encounter for immunization: Secondary | ICD-10-CM

## 2015-07-28 DIAGNOSIS — E7800 Pure hypercholesterolemia, unspecified: Secondary | ICD-10-CM | POA: Diagnosis not present

## 2015-07-28 DIAGNOSIS — N528 Other male erectile dysfunction: Secondary | ICD-10-CM | POA: Diagnosis not present

## 2015-07-28 DIAGNOSIS — N529 Male erectile dysfunction, unspecified: Secondary | ICD-10-CM

## 2015-07-28 DIAGNOSIS — R972 Elevated prostate specific antigen [PSA]: Secondary | ICD-10-CM | POA: Diagnosis not present

## 2015-07-28 DIAGNOSIS — Z0000 Encounter for general adult medical examination without abnormal findings: Secondary | ICD-10-CM | POA: Diagnosis not present

## 2015-07-28 DIAGNOSIS — Z1211 Encounter for screening for malignant neoplasm of colon: Secondary | ICD-10-CM

## 2015-07-28 LAB — COMPREHENSIVE METABOLIC PANEL
ALBUMIN: 4.5 g/dL (ref 3.6–5.1)
ALT: 40 U/L (ref 9–46)
AST: 34 U/L (ref 10–35)
Alkaline Phosphatase: 93 U/L (ref 40–115)
BUN: 12 mg/dL (ref 7–25)
CALCIUM: 9.3 mg/dL (ref 8.6–10.3)
CHLORIDE: 102 mmol/L (ref 98–110)
CO2: 25 mmol/L (ref 20–31)
Creat: 0.79 mg/dL (ref 0.70–1.25)
Glucose, Bld: 98 mg/dL (ref 65–99)
POTASSIUM: 4.4 mmol/L (ref 3.5–5.3)
Sodium: 135 mmol/L (ref 135–146)
TOTAL PROTEIN: 7.2 g/dL (ref 6.1–8.1)
Total Bilirubin: 0.6 mg/dL (ref 0.2–1.2)

## 2015-07-28 LAB — CBC
HEMATOCRIT: 42.8 % (ref 39.0–52.0)
HEMOGLOBIN: 15.2 g/dL (ref 13.0–17.0)
MCH: 31.6 pg (ref 26.0–34.0)
MCHC: 35.5 g/dL (ref 30.0–36.0)
MCV: 89 fL (ref 78.0–100.0)
MPV: 9.3 fL (ref 8.6–12.4)
PLATELETS: 249 10*3/uL (ref 150–400)
RBC: 4.81 MIL/uL (ref 4.22–5.81)
RDW: 12.8 % (ref 11.5–15.5)
WBC: 6.3 10*3/uL (ref 4.0–10.5)

## 2015-07-28 LAB — LIPID PANEL
CHOL/HDL RATIO: 6.1 ratio — AB (ref <=5.0)
CHOLESTEROL: 215 mg/dL — AB (ref 125–200)
HDL: 35 mg/dL — AB (ref >=40)
LDL Cholesterol: 159 mg/dL — ABNORMAL HIGH (ref <130)
TRIGLYCERIDES: 104 mg/dL (ref <150)
VLDL: 21 mg/dL (ref <30)

## 2015-07-28 MED ORDER — LISINOPRIL 5 MG PO TABS: 5.0000 mg | ORAL_TABLET | Freq: Every day | ORAL | Status: DC

## 2015-07-28 MED ORDER — TADALAFIL 5 MG PO TABS: ORAL_TABLET | ORAL | Status: DC

## 2015-07-28 NOTE — Progress Notes (Signed)
Subjective:    Patient ID: Shane Patterson, male    DOB: [birth year], 59 y.o.   MRN: [id number]  HPI This is a pleasant 59 yo male who presents today for CPE.   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 He had the HCTZ at home that was prescribed for him last year, so he took for 2-3 weeks. He had a back ache at the time and was taking ibuprofen 3-4 times a day during this time. He developed worsening back pain, urinary frequency and stopped it.   He had an elevated PSA for the last 2 years and sees Dr. Isabel Caprice. Had normal biopsy/US. He continues to have blood in his semen which started following a DRE. Dr. Isabel Caprice, wanted him to have MRI. The patient was concerned about the cost and did not have the test. He also has a right spermatocele. He has discomfort with excessive climbing and lifting. He is interested in having follow up, but would like to see another provider.   Review of Systems  Constitutional: Negative.   HENT: Negative.   Eyes: Negative.   Respiratory: Negative.   Cardiovascular: Negative.   Gastrointestinal: Negative.   Genitourinary: Positive for scrotal swelling.  Musculoskeletal: Positive for neck pain (stiffness for last couple of days, getting better).  Skin: Negative.   Allergic/Immunologic: Negative.   Neurological: Negative.   Hematological: Negative.        Objective:   Physical Exam Physical Exam  Constitutional: He is oriented to person, place, and time. He appears well-developed and well-nourished.  HENT:  Head: Normocephalic and atraumatic.  Right Ear: External ear normal.  Left Ear: External ear normal.  Nose: Nose normal.  Mouth/Throat: Oropharynx is clear and moist.  Eyes: Conjunctivae are normal. Pupils are equal,  round, and reactive to light.  Neck: Normal range of motion. Neck supple.  Cardiovascular: Normal rate, regular rhythm, normal heart sounds and intact distal pulses.   Pulmonary/Chest: Effort normal and breath sounds normal.  Abdominal: Soft. Bowel sounds are normal. Hernia confirmed negative in the right inguinal area and confirmed negative in the left inguinal area.  Genitourinary: Approximately 1 cm area fullness right superior scrotum, soft, nontender. Penis normal. Circumcised.  Musculoskeletal: Normal range of motion. He exhibits no edema or tenderness.       Cervical back: Normal.       Thoracic back: Normal.       Lumbar back: Normal.  Lymphadenopathy:    He has no cervical adenopathy.       Right: No inguinal adenopathy present.       Left: No inguinal adenopathy present.  Neurological: He is alert and oriented to person, place, and time. He has normal reflexes.  Skin: Skin is warm and dry.  Psychiatric: He has a normal mood and affect. His behavior is normal. Judgment normal.  Vitals reviewed.  BP 166/99 mmHg  Pulse 97  Temp(Src) 98 F (36.7 C) (Oral)  Resp 16  Ht 6\' 1"  (1.854 m)  Wt 199 lb 12.8 oz (90.629 kg)  BMI 26.37 kg/m2  SpO2 96%     Assessment & Plan:  1. Annual physical exam - encouraged continued exercise, healthy food choices, stress management.  2. Need for prophylactic vaccination and inoculation against influenza - Flu  Vaccine QUAD 36+ mos IM  3. Essential hypertension - CBC - Comprehensive metabolic panel - lisinopril (PRINIVIL,ZESTRIL) 5 MG tablet; Take 1 tablet (5 mg total) by mouth daily.  Dispense: 30 tablet; Refill: 3 - he will check his blood pressure at home 2x/week and follow up in 2 months  4. Screening for colon cancer - Ambulatory referral to Gastroenterology  5. Elevated PSA - PSA - Ambulatory referral to Urology  6. 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  Elevated cholesterol - Lipid panel  8. Erectile dysfunction, unspecified erectile dysfunction type - tadalafil (CIALIS) 5 MG tablet; Take 1/2 or 1 tablet daily as needed  Dispense: 30 tablet; Refill: 5   Olean Ree, FNP-BC  [facility], [facility]  07/28/2015 12:07 PM

## 2015-07-29 LAB — PSA: PSA: 13.03 ng/mL — ABNORMAL HIGH (ref <=4.00)

## 2015-07-30 ENCOUNTER — Encounter: Payer: Self-pay | Admitting: Family Medicine

## 2015-07-30 ENCOUNTER — Other Ambulatory Visit: Payer: Self-pay | Admitting: Family Medicine

## 2015-07-30 DIAGNOSIS — E7800 Pure hypercholesterolemia, unspecified: Secondary | ICD-10-CM

## 2015-07-30 MED ORDER — ATORVASTATIN CALCIUM 10 MG PO TABS: 10.0000 mg | ORAL_TABLET | Freq: Every day | ORAL | Status: DC

## 2016-08-02 ENCOUNTER — Encounter: Payer: Self-pay | Admitting: Family Medicine

## 2016-08-02 ENCOUNTER — Ambulatory Visit (INDEPENDENT_AMBULATORY_CARE_PROVIDER_SITE_OTHER): Payer: BLUE CROSS/BLUE SHIELD | Admitting: Family Medicine

## 2016-08-02 VITALS — BP 156/80 | HR 82 | Temp 98.6°F | Resp 16 | Ht 74.0 in | Wt 202.0 lb

## 2016-08-02 DIAGNOSIS — N529 Male erectile dysfunction, unspecified: Secondary | ICD-10-CM

## 2016-08-02 DIAGNOSIS — R972 Elevated prostate specific antigen [PSA]: Secondary | ICD-10-CM

## 2016-08-02 DIAGNOSIS — R5383 Other fatigue: Secondary | ICD-10-CM

## 2016-08-02 DIAGNOSIS — J0120 Acute ethmoidal sinusitis, unspecified: Secondary | ICD-10-CM

## 2016-08-02 DIAGNOSIS — Z23 Encounter for immunization: Secondary | ICD-10-CM | POA: Diagnosis not present

## 2016-08-02 DIAGNOSIS — Z0000 Encounter for general adult medical examination without abnormal findings: Secondary | ICD-10-CM

## 2016-08-02 MED ORDER — TADALAFIL 5 MG PO TABS: ORAL_TABLET | ORAL | 5 refills | Status: DC

## 2016-08-02 MED ORDER — FLUTICASONE PROPIONATE 50 MCG/ACT NA SUSP: 2.0000 | Freq: Every day | NASAL | 6 refills | Status: DC

## 2016-08-02 MED ORDER — AMOXICILLIN 875 MG PO TABS: 875.0000 mg | ORAL_TABLET | Freq: Two times a day (BID) | ORAL | 0 refills | Status: DC

## 2016-08-02 NOTE — Patient Instructions (Signed)
     IF you received an x-ray today, you will receive an invoice from [facility]. Please contact [facility] at [phone] with questions or concerns regarding your invoice.   IF you received labwork today, you will receive an invoice from [facility]. Please contact [facility] at [phone] with questions or concerns regarding your invoice.   Our billing staff will not be able to assist you with questions regarding bills from these companies.  You will be contacted with the lab results as soon as they are available. The fastest way to get your results is to activate your My Chart account. Instructions are located on the last page of this paperwork. If you have not heard from us regarding the results in 2 weeks, please contact this office.      

## 2016-08-02 NOTE — Progress Notes (Signed)
Chief Complaint  Patient presents with  . Annual Exam    cpe    Subjective:  Shane Patterson is a 60 y.o. male here for a health maintenance visit.  Patient is established pt  Erectile dysfunction and elevated PSA Pt would like to establish with a different urologist.  He wanted to know if he is still considered sterile. He also wanted to know if he should keep using contraception (girlfriend uses OCPs).  He also wanted to get his testosterone levels checked due to his fatigue and low muscle tone.  Acute URI Reports that he has been having 3 days of sinus congestion, nasal congestion, facial pain but denies fevers or chills.  Reports that he has seasonal allergies and this weekend he was doing lawn work when his symptoms started.  He reports that he typically uses amoxicillin.  States that he was prescribed augmentin and but subsequently using amoxicillin did not cause recurrent adverse reaction that included n/v/d.    Patient Active Problem List   Diagnosis Date Noted  . Elevated PSA 03/18/2014  . 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 Erectile dysfunction 07/18/2013    Past Medical History:  Diagnosis Date  . Allergy    CATS and PLANTS  . ED (erectile dysfunction)   . Elevated PSA   . Spermatocele     History reviewed. No pertinent surgical history.   Outpatient Medications Prior to Visit  Medication Sig Dispense Refill  . atorvastatin (LIPITOR) 10 MG tablet Take 1 tablet (10 mg total) by mouth daily. 90 tablet 1  . lisinopril (PRINIVIL,ZESTRIL) 5 MG tablet Take 1 tablet (5 mg total) by mouth daily. 30 tablet 3  . tadalafil (CIALIS) 5 MG tablet Take 1/2 or 1 tablet daily as needed 30 tablet 5   No facility-administered medications prior to visit.     Allergies  Allergen Reactions  . Augmentin [Amoxicillin-Pot Clavulanate] Diarrhea and Nausea And Vomiting     Family History  Problem Relation Age of Onset  . Diabetes Father   . Diabetes Paternal Grandmother      Health  Habits: Dental Exam: up to date Eye Exam: up to date Exercise:  times/week on average Current exercise activities: walking/running Diet:   Social History   Social History  . Marital status: Single    Spouse name: N/A  . Number of children: N/A  . Years of education: N/A   Occupational History  . CONTRACTOR    Social History Main Topics  . Smoking status: Never Smoker  . Smokeless tobacco: Never Used  . Alcohol use 0.0 oz/week     Comment: BEER orWINE  . Drug use: No  . Sexual activity: No   Other Topics Concern  . Not on file   Social History Narrative  . No narrative on file   History  Alcohol Use  . 0.0 oz/week    Comment: BEER orWINE   History  Smoking Status  . Never Smoker  Smokeless Tobacco  . Never Used   History  Drug Use No    GYN: Sexual Health Menstrual status: regular menses LMP: No LMP for male patient. Last pap smear: see HM section History of abnormal pap smears:  Sexually active: ** with ** partner Current contraception:   Health Maintenance: See under health Maintenance activity for review of completion dates as well. Immunization History  Administered Date(s) Administered  . Influenza,inj,Quad PF,36+ Mos 07/27/2014, 07/28/2015, 08/02/2016      Depression Screen-PHQ2/9 [facility] 07/28/2015 07/27/2014  Decreased  Interest 0 0  Down, Depressed, Hopeless 0 0  PHQ - 2 Score 0 0      Depression Severity and Treatment Recommendations:  0-4= None  5-9= Mild / Treatment: Support, educate to call if worse; return in one month  10-14= Moderate / Treatment: Support, watchful waiting; Antidepressant or Psycotherapy  15-19= Moderately severe / Treatment: Antidepressant OR Psychotherapy  >= 20 = Major depression, severe / Antidepressant AND Psychotherapy    Review of Systems   Review of Systems  Constitutional: Negative for chills, fever and weight loss.  HENT: Positive for congestion and sinus pain. Negative for  hearing loss.   Eyes: Negative for double vision, photophobia and pain.  Respiratory: Positive for stridor.   Cardiovascular: Negative for chest pain, palpitations and orthopnea.  Gastrointestinal: Negative for abdominal pain, nausea and vomiting.  Genitourinary: Negative for dysuria, frequency and urgency.  Musculoskeletal: Negative for back pain and neck pain.  Skin: Negative for itching and rash.  Neurological: Negative for dizziness, tingling and headaches.    See HPI for ROS as well.    Objective:   Vitals:   08/02/16 1130  BP: (!) 156/80  Pulse: 82  Resp: 16  Temp: 98.6 F (37 C)  TempSrc: Oral  SpO2: 96%  Weight: 202 lb (91.6 kg)  Height: 6\' 2"  (1.88 m)   BP Readings from Last 3 Encounters:  08/02/16 (!) 156/80  07/28/15 (!) 140/98  08/17/14 (!) 157/108     Body mass index is 25.94 kg/m.  Physical Exam  Constitutional: He is oriented to person, place, and time. He appears well-developed and well-nourished.  Facial tenderness in the frontal and ethmoid region  HENT:  Head: Normocephalic and atraumatic.  Right Ear: External ear normal.  Left Ear: External ear normal.  Nose: Nose normal.  Mouth/Throat: Oropharynx is clear and moist.  TM bulging bilaterally with clear fluid  Eyes: Conjunctivae and EOM are normal.  Neck: Normal range of motion. Neck supple.  Cardiovascular: Normal rate, regular rhythm, normal heart sounds and intact distal pulses.   No murmur heard. Pulmonary/Chest: Effort normal and breath sounds normal. No respiratory distress. He has no wheezes. He has no rales. He exhibits no tenderness.  Abdominal: Soft. Bowel sounds are normal. He exhibits no distension and no mass. There is no tenderness. There is no rebound and no guarding.  Musculoskeletal: Normal range of motion. He exhibits no edema.  Neurological: He is alert and oriented to person, place, and time. He has normal reflexes.  Skin: Skin is warm. No rash noted. No erythema.   Psychiatric: He has a normal mood and affect. His behavior is normal. Judgment and thought content normal.       Assessment/Plan:   Patient was seen for a health maintenance exam.  Counseled the patient on health maintenance issues. Reviewed her health mainteance schedule and ordered appropriate tests (see orders.) Counseled on regular exercise and weight management. Recommend regular eye exams and dental cleaning.   The following issues were addressed today for health maintenance:   Shane Patterson was seen today for annual exam.  Diagnoses and all orders for this visit:  Fatigue, unspecified type- will check levels today for hypogonadism -     TestT+TestF+SHBG  Health maintenance examination- age appropriate screenings review -     Comprehensive metabolic panel -     Hemoglobin A1c -     Lipid panel -     TSH  Elevated PSA- advised pt to follow up with Urology Will check levels  today to track PSA -     PSA -     Ambulatory referral to Urology  Acute non-recurrent ethmoidal sinusitis- advised supportive care If pt symptoms progressive he should take amoxicillin bid  Discussed that at this time he is developing sinusitis Reviewed his allergy list and decided that since he has been tolerating amoxicillin will give if his symptoms continue -     fluticasone (FLONASE) 50 MCG/ACT nasal spray; Place 2 sprays into both nostrils daily. -     amoxicillin (AMOXIL) 875 MG tablet; Take 1 tablet (875 mg total) by mouth 2 (two) times daily. Erectile dysfunction, unspecified erectile dysfunction type -     Discontinue: tadalafil (CIALIS) 5 MG tablet; Take 1/2 or 1 tablet daily as needed -     tadalafil (CIALIS) 5 MG tablet; Take 1/2 or 1 tablet daily as needed  Flu vaccine need -     Flu Vaccine QUAD 36+ mos PF IM (Fluarix & Fluzone Quad PF)  Other orders     No Follow-up on file.    Body mass index is 25.94 kg/m.:  Discussed the patient's BMI with patient. The BMI body mass index is  25.94 kg/m.     No future appointments.  Patient Instructions       IF you received an x-ray today, you will receive an invoice from [facility]. Please contact [facility] at [phone] with questions or concerns regarding your invoice.   IF you received labwork today, you will receive an invoice from [employer]. Please contact [facility] at [phone] with questions or concerns regarding your invoice.   Our billing staff will not be able to assist you with questions regarding bills from these companies.  You will be contacted with the lab results as soon as they are available. The fastest way to get your results is to activate your My Chart account. Instructions are located on the last page of this paperwork. If you have not heard from [place] regarding the results in 2 weeks, please contact this office.

## 2016-08-03 ENCOUNTER — Other Ambulatory Visit: Payer: Self-pay

## 2016-08-03 DIAGNOSIS — N529 Male erectile dysfunction, unspecified: Secondary | ICD-10-CM

## 2016-08-03 MED ORDER — TADALAFIL 5 MG PO TABS: ORAL_TABLET | ORAL | 5 refills | Status: DC

## 2016-08-03 NOTE — Telephone Encounter (Signed)
The cialis Rx Dr Creta LevinStallings wrote yesterday for pt printed instead of going electronically. I re-sent to pharm as written.

## 2016-08-06 LAB — TESTT+TESTF+SHBG
SEX HORMONE BINDING: 36.4 nmol/L (ref 19.3–76.4)
Testosterone, Free: 14.4 pg/mL (ref 6.6–18.1)
Testosterone, total: 489.8 ng/dL (ref 264.0–916.0)

## 2016-08-06 LAB — HEMOGLOBIN A1C
Est. average glucose Bld gHb Est-mCnc: 114 mg/dL
HEMOGLOBIN A1C: 5.6 % (ref 4.8–5.6)

## 2016-08-06 LAB — COMPREHENSIVE METABOLIC PANEL
ALK PHOS: 114 IU/L (ref 39–117)
ALT: 36 IU/L (ref 0–44)
AST: 34 IU/L (ref 0–40)
Albumin/Globulin Ratio: 2 (ref 1.2–2.2)
Albumin: 4.7 g/dL (ref 3.6–4.8)
BILIRUBIN TOTAL: 0.4 mg/dL (ref 0.0–1.2)
BUN/Creatinine Ratio: 13 (ref 10–24)
BUN: 11 mg/dL (ref 8–27)
CHLORIDE: 100 mmol/L (ref 96–106)
CO2: 27 mmol/L (ref 18–29)
Calcium: 9.6 mg/dL (ref 8.6–10.2)
Creatinine, Ser: 0.86 mg/dL (ref 0.76–1.27)
GFR calc Af Amer: 108 mL/min/{1.73_m2} (ref >59)
GFR calc non Af Amer: 94 mL/min/{1.73_m2} (ref >59)
GLUCOSE: 91 mg/dL (ref 65–99)
Globulin, Total: 2.4 g/dL (ref 1.5–4.5)
Potassium: 4.7 mmol/L (ref 3.5–5.2)
Sodium: 143 mmol/L (ref 134–144)
TOTAL PROTEIN: 7.1 g/dL (ref 6.0–8.5)

## 2016-08-06 LAB — PSA: PROSTATE SPECIFIC AG, SERUM: 12.2 ng/mL — AB (ref 0.0–4.0)

## 2016-08-06 LAB — LIPID PANEL
CHOLESTEROL TOTAL: 197 mg/dL (ref 100–199)
Chol/HDL Ratio: 5.6 ratio units — ABNORMAL HIGH (ref 0.0–5.0)
HDL: 35 mg/dL — ABNORMAL LOW (ref >39)
LDL Calculated: 134 mg/dL — ABNORMAL HIGH (ref 0–99)
Triglycerides: 140 mg/dL (ref 0–149)
VLDL Cholesterol Cal: 28 mg/dL (ref 5–40)

## 2016-08-06 LAB — TSH: TSH: 1.39 u[IU]/mL (ref 0.450–4.500)

## 2016-08-07 ENCOUNTER — Encounter: Payer: Self-pay | Admitting: Family Medicine

## 2020-01-14 ENCOUNTER — Other Ambulatory Visit: Payer: Self-pay | Admitting: Family

## 2020-01-14 DIAGNOSIS — N50812 Left testicular pain: Secondary | ICD-10-CM

## 2020-01-20 ENCOUNTER — Ambulatory Visit
Admission: RE | Admit: 2020-01-20 | Discharge: 2020-01-20 | Disposition: A | Discharge status: Home / Self Care | Payer: 59 | Source: Ambulatory Visit | Attending: Family | Admitting: Family

## 2020-01-20 DIAGNOSIS — N50812 Left testicular pain: Secondary | ICD-10-CM

## 2020-02-03 DIAGNOSIS — M545 Low back pain, unspecified: Secondary | ICD-10-CM | POA: Insufficient documentation

## 2020-06-24 ENCOUNTER — Ambulatory Visit: Payer: Self-pay | Admitting: Orthopedic Surgery

## 2020-07-15 ENCOUNTER — Encounter (HOSPITAL_COMMUNITY): Payer: Medicare HMO

## 2020-07-28 ENCOUNTER — Ambulatory Visit: Admission: RE | Admit: 2020-07-28 | Payer: Medicare HMO | Source: Home / Self Care | Admitting: Specialist

## 2020-07-28 ENCOUNTER — Encounter: Admission: RE | Payer: Self-pay | Source: Home / Self Care

## 2020-07-28 SURGERY — SHOULDER ARTHROSCOPY WITH SUBACROMIAL DECOMPRESSION, ROTATOR CUFF REPAIR AND BICEP TENDON REPAIR
Anesthesia: Choice | Laterality: Left

## 2020-10-27 ENCOUNTER — Other Ambulatory Visit: Payer: Self-pay

## 2020-10-27 ENCOUNTER — Encounter (HOSPITAL_COMMUNITY): Payer: Self-pay | Admitting: Emergency Medicine

## 2020-10-27 ENCOUNTER — Emergency Department (HOSPITAL_COMMUNITY)
Admission: EM | Admit: 2020-10-27 | Discharge: 2020-10-27 | Disposition: A | Discharge status: Home / Self Care | Payer: Medicare HMO | Attending: Emergency Medicine | Admitting: Emergency Medicine

## 2020-10-27 DIAGNOSIS — R339 Retention of urine, unspecified: Secondary | ICD-10-CM | POA: Diagnosis not present

## 2020-10-27 LAB — BASIC METABOLIC PANEL
Anion gap: 13 (ref 5–15)
BUN: 17 mg/dL (ref 8–23)
CO2: 23 mmol/L (ref 22–32)
Calcium: 9.8 mg/dL (ref 8.9–10.3)
Chloride: 103 mmol/L (ref 98–111)
Creatinine, Ser: 0.85 mg/dL (ref 0.61–1.24)
GFR, Estimated: >60 mL/min (ref >60)
Glucose, Bld: 107 mg/dL — ABNORMAL HIGH (ref 70–99)
Potassium: 4.1 mmol/L (ref 3.5–5.1)
Sodium: 139 mmol/L (ref 135–145)

## 2020-10-27 LAB — CBC WITH DIFFERENTIAL/PLATELET
Abs Immature Granulocytes: 0.02 10*3/uL (ref 0.00–0.07)
Basophils Absolute: 0.1 10*3/uL (ref 0.0–0.1)
Basophils Relative: 1 %
Eosinophils Absolute: 0.2 10*3/uL (ref 0.0–0.5)
Eosinophils Relative: 3 %
HCT: 44.8 % (ref 39.0–52.0)
Hemoglobin: 15.7 g/dL (ref 13.0–17.0)
Immature Granulocytes: 0 %
Lymphocytes Relative: 30 %
Lymphs Abs: 2.1 10*3/uL (ref 0.7–4.0)
MCH: 32.3 pg (ref 26.0–34.0)
MCHC: 35 g/dL (ref 30.0–36.0)
MCV: 92.2 fL (ref 80.0–100.0)
Monocytes Absolute: 0.5 10*3/uL (ref 0.1–1.0)
Monocytes Relative: 7 %
Neutro Abs: 4.1 10*3/uL (ref 1.7–7.7)
Neutrophils Relative %: 59 %
Platelets: 262 10*3/uL (ref 150–400)
RBC: 4.86 MIL/uL (ref 4.22–5.81)
RDW: 11.8 % (ref 11.5–15.5)
WBC: 7 10*3/uL (ref 4.0–10.5)
nRBC: 0 % (ref 0.0–0.2)

## 2020-10-27 LAB — URINALYSIS, ROUTINE W REFLEX MICROSCOPIC
Bilirubin Urine: NEGATIVE
Glucose, UA: NEGATIVE mg/dL
Hgb urine dipstick: NEGATIVE
Ketones, ur: NEGATIVE mg/dL
Leukocytes,Ua: NEGATIVE
Nitrite: NEGATIVE
Protein, ur: NEGATIVE mg/dL
Specific Gravity, Urine: 1.008 (ref 1.005–1.030)
pH: 5 (ref 5.0–8.0)

## 2020-10-27 NOTE — ED Notes (Signed)
Patient verbalizes understanding of discharge instructions. Opportunity for questioning and answers were provided. Armband removed by staff, pt discharged from ED.  

## 2020-10-27 NOTE — ED Provider Notes (Signed)
EMERGENCY DEPARTMENT Provider Note  CSN: [id number] Arrival date & time: 10/27/20 0998    History Chief Complaint  Patient presents with  . Urinary Retention    HPI  Shane Patterson is a 65 y.o. male with history of BPH and elevated PSA has been followed by urology, previous biopsy was negative.He reports he was in his usual state of health last night when he went to bed, woke up this morning with a sensation he needed to urinate, but could not pass any urine. He denies any new or changed medications. He denies any recent dysuria, flank pain or fever. Prior to my evaluation he apparently had an in-and-out cath done with removal of >900cc of urine. Currently he is resting comfortably in no distress and asymptomatic.    Past Medical History:  Diagnosis Date  . Allergy    CATS and PLANTS  . ED (erectile dysfunction)   . Elevated PSA   . Spermatocele     History reviewed. No pertinent surgical history.  Family History  Problem Relation Age of Onset  . Diabetes Father   . Diabetes Paternal Grandmother     Social History   Tobacco Use  . Smoking status: Never Smoker  . Smokeless tobacco: Never Used  Substance Use Topics  . Alcohol use: Yes    Alcohol/week: 0.0 standard drinks    Comment: BEER orWINE  . Drug use: No     Home Medications Prior to Admission medications   Medication Sig Start Date End Date Taking? Authorizing Provider  atorvastatin (LIPITOR) 10 MG tablet Take 1 tablet (10 mg total) by mouth daily. 07/30/15   Emi Belfast, FNP  fluticasone (FLONASE) 50 MCG/ACT nasal spray Place 2 sprays into both nostrils daily. 08/02/16   Doristine Bosworth, MD  lisinopril (PRINIVIL,ZESTRIL) 5 MG tablet Take 1 tablet (5 mg total) by mouth daily. 07/28/15   Emi Belfast, FNP  tadalafil (CIALIS) 5 MG tablet Take 1/2 or 1 tablet daily as needed 08/03/16   Doristine Bosworth, MD     Allergies    Augmentin [amoxicillin-pot clavulanate]   Review of Systems    Review of Systems A comprehensive review of systems was completed and negative except as noted in HPI.    Physical Exam BP (!) 140/92 (BP Location: Right Arm)   Pulse 69   Temp 98.1 F (36.7 C) (Oral)   Resp 16   Ht 6\' 1"  (1.854 m)   Wt 88.5 kg   SpO2 98%   BMI 25.73 kg/m   Physical Exam Vitals and nursing note reviewed.  Constitutional:      Appearance: Normal appearance.  HENT:     Head: Normocephalic and atraumatic.     Nose: Nose normal.     Mouth/Throat:     Mouth: Mucous membranes are moist.  Eyes:     Extraocular Movements: Extraocular movements intact.     Conjunctiva/sclera: Conjunctivae normal.  Cardiovascular:     Rate and Rhythm: Normal rate.  Pulmonary:     Effort: Pulmonary effort is normal.     Breath sounds: Normal breath sounds.  Abdominal:     General: Abdomen is flat.     Palpations: Abdomen is soft.     Tenderness: There is no abdominal tenderness.  Musculoskeletal:        General: No swelling. Normal range of motion.     Cervical back: Neck supple.  Skin:    General: Skin is warm and dry.  Neurological:  General: No focal deficit present.     Mental Status: He is alert.  Psychiatric:        Mood and Affect: Mood normal.      ED Results / Procedures / Treatments   Labs (all labs ordered are listed, but only abnormal results are displayed) Labs Reviewed  URINALYSIS, ROUTINE W REFLEX MICROSCOPIC - Abnormal; Notable for the following components:      Result Value   Color, Urine STRAW (*)    All other components within normal limits  CBC WITH DIFFERENTIAL/PLATELET  BASIC METABOLIC PANEL    EKG None  Radiology No results found.  Procedures Procedures  Medications Ordered in the ED Medications - No data to display   MDM Rules/Calculators/A&P MDM Patient has had in-and-out cath done with relief of symptoms. It isn't clear why a foley wasn't placed initially, but patient was informed that in some cases an indwelling  catheter is required if he is unable to void prior to discharge. Will check UA as well as CBC, BMP.   ED Course  I have reviewed the triage vital signs and the nursing notes.  Pertinent labs & imaging results that were available during my care of the patient were reviewed by me and considered in my medical decision making (see chart for details).  Clinical Course as of 10/27/20 0937  Wed Oct 27, 2020  0856 UA neg for infection.  [CS]  0925 CBC is normal.  [CS]  0935 Patient states he has been able to urinate twice since he was catheterized. First time had blood but the second was clear and he would like to go home. Advised he if becomes obstructed again he would likely need to have an indwelling catheter for a few days. Recommend outpatient urology follow up.  [CS]    Clinical Course User Index [CS] Pollyann Savoy, MD    Final Clinical Impression(s) / ED Diagnoses Final diagnoses:  Urinary retention    Rx / DC Orders ED Discharge Orders    None       Pollyann Savoy, MD 10/27/20 [phone]

## 2020-10-27 NOTE — ED Triage Notes (Signed)
Patient from home, states that he is unable to pee, states he woke up at 4am feeling like he needed to urinate but unable.  No changes in medications.

## 2020-11-14 IMAGING — US US SCROTUM W/ DOPPLER COMPLETE
1 series · 13 of 25 positions shown · non-contrast
Comparison: None

CLINICAL DATA: LEFT greater than RIGHT testicular pain for 2 weeks

EXAM:
SCROTAL ULTRASOUND
DOPPLER ULTRASOUND OF THE TESTICLES
TECHNIQUE: Complete ultrasound examination of the testicles, epididymis, and
other scrotal structures was performed. Color and spectral Doppler
ultrasound were also utilized to evaluate blood flow to the
testicles.

[Series 1: us scrotum w/ doppler complete · 0.06mm/px · 13 of 92 slices shown]
[im 1/92]
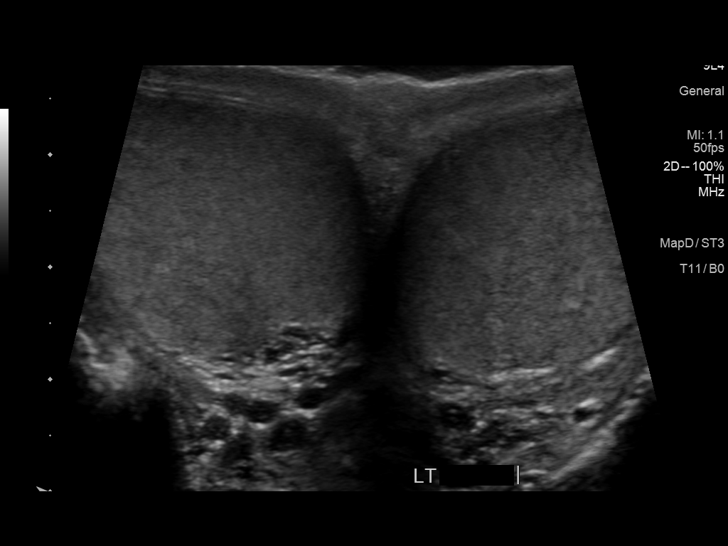
[im 8/92]
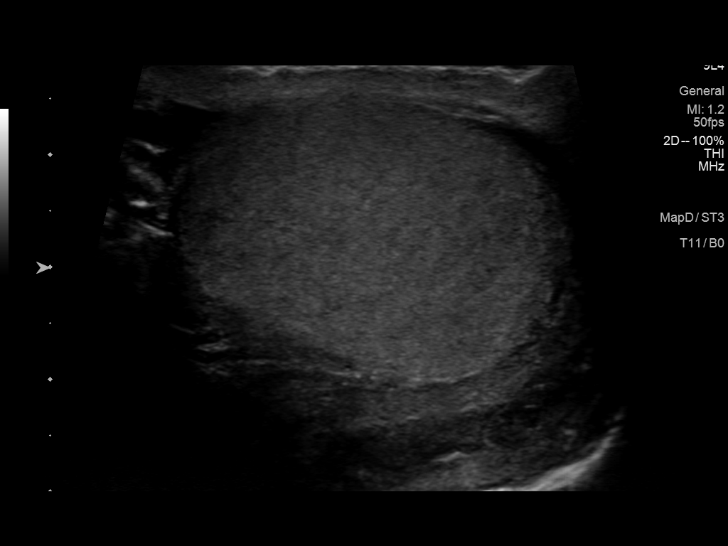
[im 16/92]
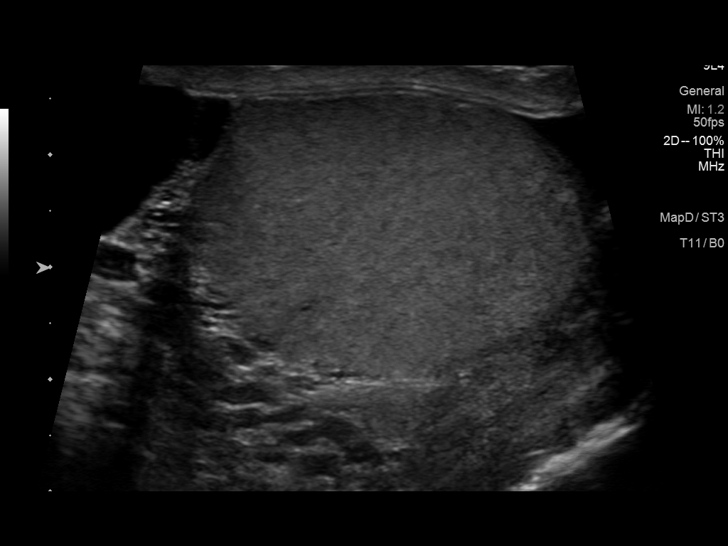
[im 23/92]
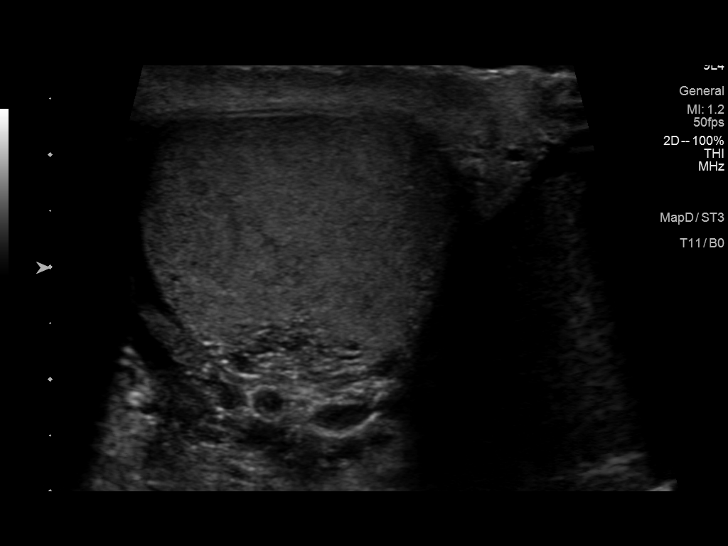
[im 31/92]
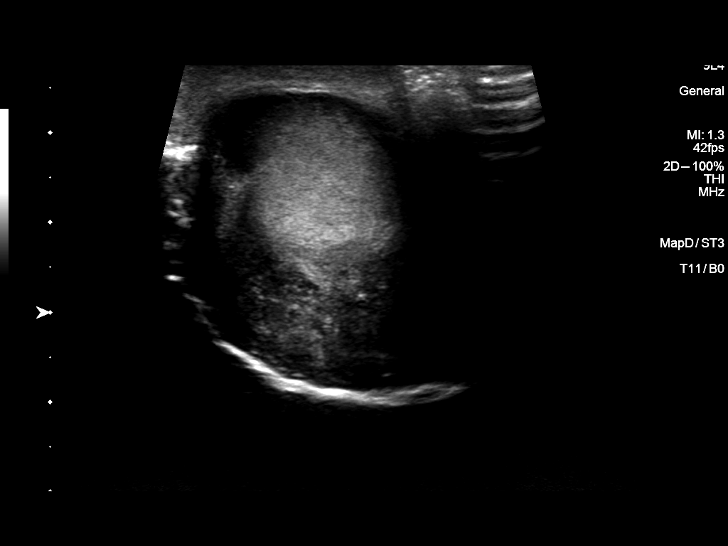
[im 38/92]
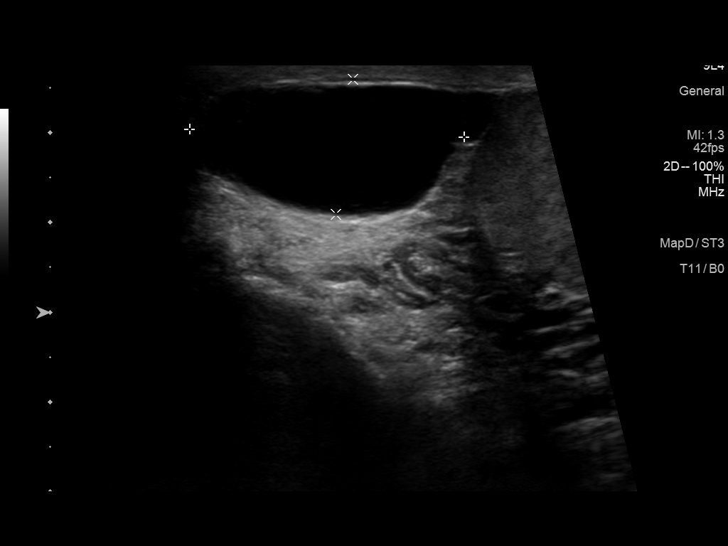
[im 46/92]
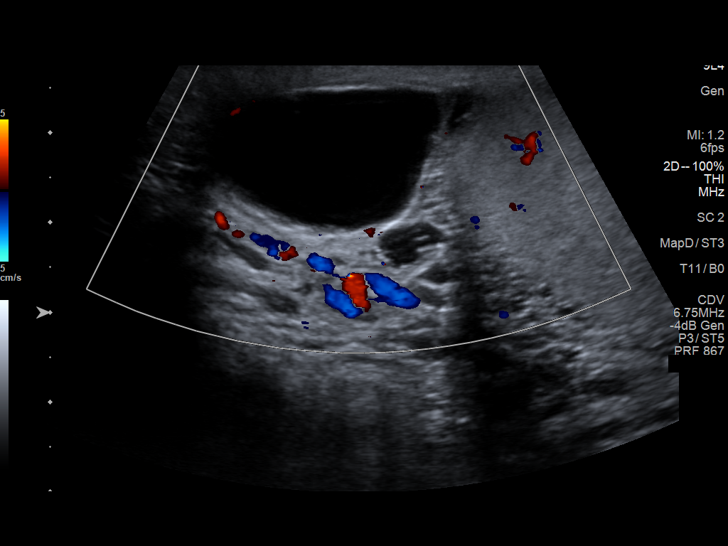
[im 54/92]
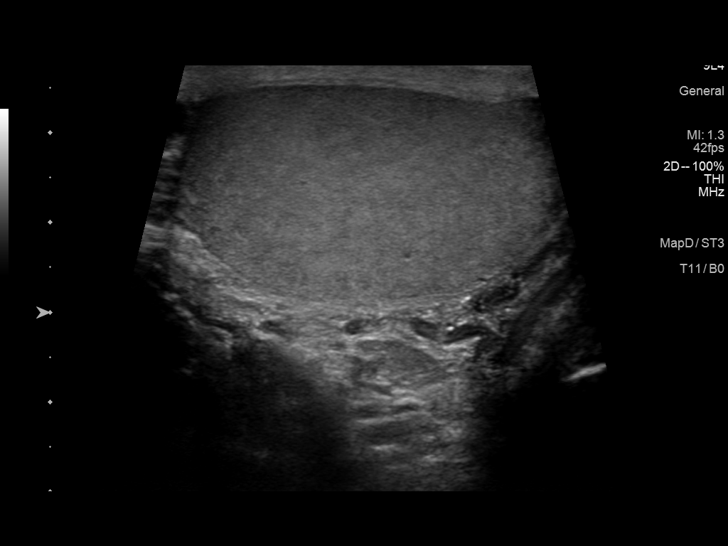
[im 61/92]
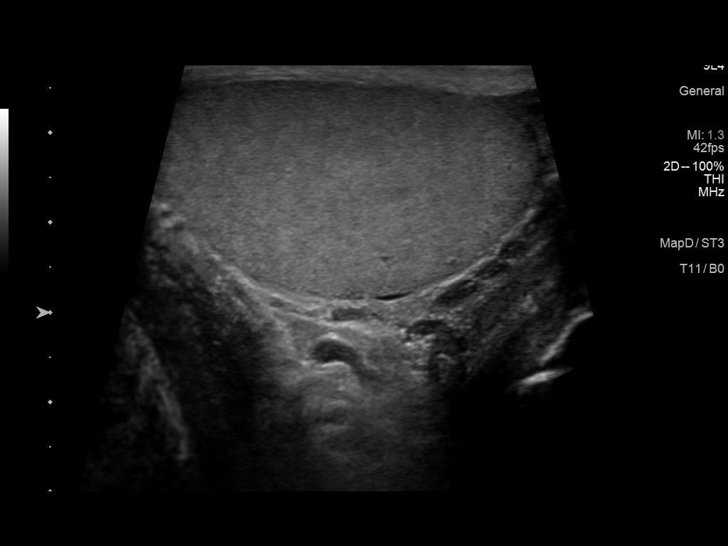
[im 69/92]
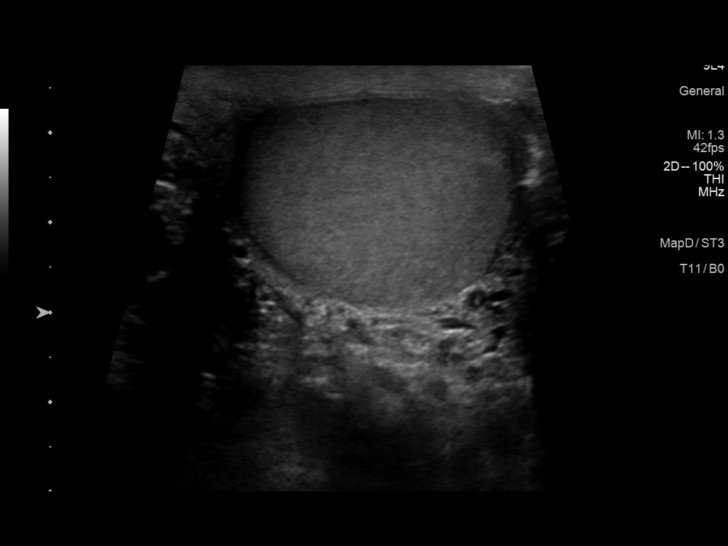
[im 76/92]
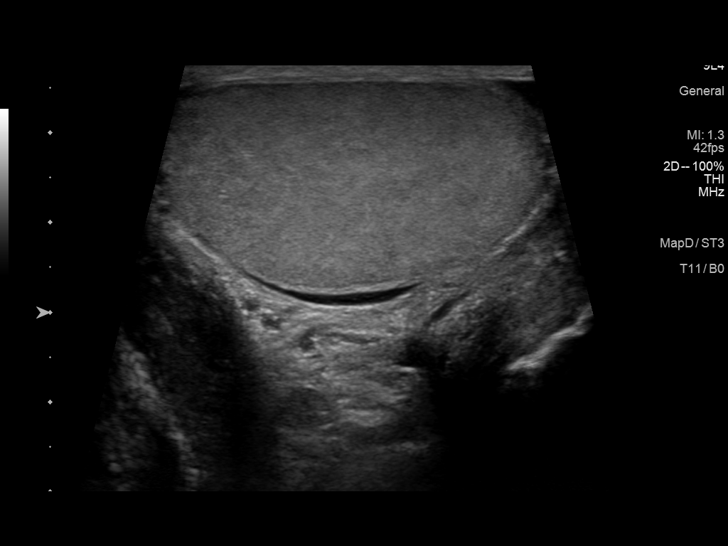
[im 84/92]
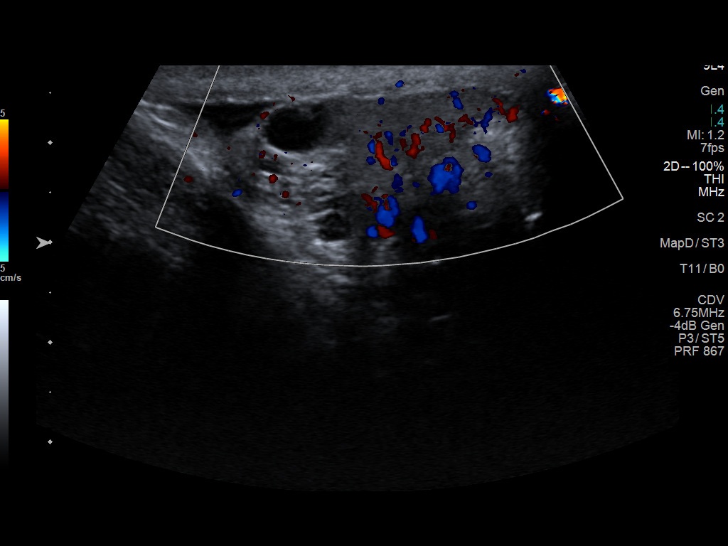
[im 92/92]
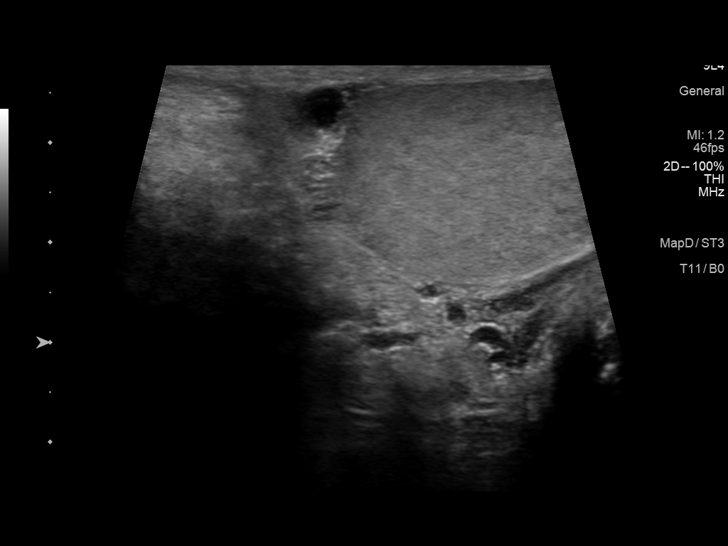

[13 of 25 positions shown; findings below may reference images not displayed]

FINDINGS: Right testicle

Measurements: 3.7 x 2.3 x 2.9 cm. Normal echogenicity without mass
or calcification. Small focus of tubular ectasia of the rete testis
identified within RIGHT testis. Internal blood flow present on color
Doppler imaging.

Left testicle

Measurements: 4.2 x 2.4 x 2.8 cm. Normal echogenicity without mass
or calcification. Small focus of tubular ectasia of the rete testis
identified within LEFT testis. Internal blood flow present on color
Doppler imaging, symmetric with RIGHT

Right epididymis: 2 cysts at RIGHT epididymal head, 31 x 15 x 22 mm
and 24 x 11 x 10 mm.

Left epididymis:  Tiny cyst at LEFT epididymal head 6 x 5 x 6 mm

Hydrocele:  Trace LEFT hydrocele

Varicocele:  None visualized.

Pulsed Doppler interrogation of both testes demonstrates normal low
resistance arterial and venous waveforms bilaterally.
IMPRESSION: Cysts within the epididymal heads bilaterally much larger on RIGHT,
may represent epididymal cysts or spermatoceles.

Incidentally noted tubular ectasia of the rete testis bilaterally.

Otherwise normal exam.

## 2020-12-28 ENCOUNTER — Telehealth: Payer: Self-pay

## 2020-12-28 NOTE — Telephone Encounter (Signed)
Ok- let him know we can see him.  He can re-establish with me or see Ria Clock, the NP who is working with me now

## 2020-12-28 NOTE — Telephone Encounter (Signed)
Patient states he was your patient back when you were working in [place].Marland Kitchen would like to re-establish care

## 2020-12-29 NOTE — Telephone Encounter (Signed)
lvm to schedule appt.  

## 2021-01-09 NOTE — Progress Notes (Deleted)
[facility] [address], [address] [place], [place] [address] [phone]  Date:  01/13/2021   Name:  Shane Patterson   DOB:  1955/12/06   MRN:  [id number]  PCP:  Patient, No Pcp Per (Inactive)    Chief Complaint: No chief complaint on file.   History of Present Illness:  Shane Patterson is a 65 y.o. very pleasant male patient who presents with the following:  New patient here today to establish care   Patient Active Problem List   Diagnosis Date Noted  . Elevated PSA 03/18/2014  . 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 Erectile dysfunction 07/18/2013    Past Medical History:  Diagnosis Date  . Allergy    CATS and PLANTS  . ED (erectile dysfunction)   . Elevated PSA   . Spermatocele     No past surgical history on file.  Social History   Tobacco Use  . Smoking status: Never Smoker  . Smokeless tobacco: Never Used  Substance Use Topics  . Alcohol use: Yes    Alcohol/week: 0.0 standard drinks    Comment: BEER orWINE  . Drug use: No    Family History  Problem Relation Age of Onset  . Diabetes Father   . Diabetes Paternal Grandmother     Allergies  Allergen Reactions  . Augmentin [Amoxicillin-Pot Clavulanate] Diarrhea and Nausea And Vomiting    Medication list has been reviewed and updated.  Current Outpatient Medications on File Prior to Visit  Medication Sig Dispense Refill  . atorvastatin (LIPITOR) 10 MG tablet Take 1 tablet (10 mg total) by mouth daily. 90 tablet 1  . fluticasone (FLONASE) 50 MCG/ACT nasal spray Place 2 sprays into both nostrils daily. 16 g 6  . lisinopril (PRINIVIL,ZESTRIL) 5 MG tablet Take 1 tablet (5 mg total) by mouth daily. 30 tablet 3  . tadalafil (CIALIS) 5 MG tablet Take 1/2 or 1 tablet daily as needed 30 tablet 5   No current facility-administered medications on file prior to visit.    Review of Systems:  As per HPI- otherwise negative.    Physical Examination: There were no  vitals filed for this visit. There were no vitals filed for this visit. There is no height or weight on file to calculate BMI. Ideal Body Weight:    GEN: no acute distress. HEENT: Atraumatic, Normocephalic.  Ears and Nose: No external deformity. CV: RRR, No M/G/R. No JVD. No thrill. No extra heart sounds. PULM: CTA B, no wheezes, crackles, rhonchi. No retractions. No resp. distress. No accessory muscle use. ABD: S, NT, ND, +BS. No rebound. No HSM. EXTR: No c/c/e PSYCH: Normally interactive. Conversant.    Assessment and Plan: *** This visit occurred during the SARS-CoV-2 public health emergency.  Safety protocols were in place, including screening questions prior to the visit, additional usage of staff PPE, and extensive cleaning of exam room while observing appropriate contact time as indicated for disinfecting solutions.    Signed Abbe Amsterdam, MD

## 2021-01-13 ENCOUNTER — Ambulatory Visit: Payer: Medicare HMO | Admitting: Family Medicine

## 2021-08-17 ENCOUNTER — Telehealth: Payer: Self-pay | Admitting: Family Medicine

## 2021-08-17 NOTE — Telephone Encounter (Signed)
Are you okay with this?

## 2021-08-17 NOTE — Telephone Encounter (Signed)
Pt would like to reestablish care again. Please advise.

## 2021-08-18 NOTE — Telephone Encounter (Signed)
Patient was informed of Cop[facility]'s response. He stated he understood.

## 2021-10-06 ENCOUNTER — Encounter: Payer: Self-pay | Admitting: Urology

## 2021-10-06 ENCOUNTER — Other Ambulatory Visit: Payer: Self-pay | Admitting: Urology

## 2021-10-06 ENCOUNTER — Other Ambulatory Visit: Payer: Self-pay

## 2021-10-06 ENCOUNTER — Ambulatory Visit (INDEPENDENT_AMBULATORY_CARE_PROVIDER_SITE_OTHER): Payer: PPO | Admitting: Urology

## 2021-10-06 VITALS — BP 146/88 | HR 80 | Wt 190.0 lb

## 2021-10-06 DIAGNOSIS — N138 Other obstructive and reflux uropathy: Secondary | ICD-10-CM

## 2021-10-06 DIAGNOSIS — N529 Male erectile dysfunction, unspecified: Secondary | ICD-10-CM

## 2021-10-06 DIAGNOSIS — N401 Enlarged prostate with lower urinary tract symptoms: Secondary | ICD-10-CM | POA: Diagnosis not present

## 2021-10-06 DIAGNOSIS — R972 Elevated prostate specific antigen [PSA]: Secondary | ICD-10-CM | POA: Diagnosis not present

## 2021-10-06 LAB — URINALYSIS, ROUTINE W REFLEX MICROSCOPIC
Bilirubin, UA: NEGATIVE
Glucose, UA: NEGATIVE
Ketones, UA: NEGATIVE
Leukocytes,UA: NEGATIVE
Nitrite, UA: NEGATIVE
Protein,UA: NEGATIVE
RBC, UA: NEGATIVE
Specific Gravity, UA: 1.015 (ref 1.005–1.030)
Urobilinogen, Ur: 0.2 mg/dL (ref 0.2–1.0)
pH, UA: 6.5 (ref 5.0–7.5)

## 2021-10-06 NOTE — Progress Notes (Signed)
Assessment: 1. Elevated PSA   2. BPH with obstruction/lower urinary tract symptoms   3. Organic impotence     Plan: I reviewed the patient's records from [place]. I discussed the potential significance of his rising PSA.  Options for further evaluation discussed including further blood and urine based testing, prostate MRI, and repeat biopsy. ExoDx test sent today We will contact him with results and recommendations. Continue daily tadalafil.  Chief Complaint: Chief Complaint  Patient presents with   Elevated PSA    HPI: Shane Patterson is a 66 y.o. male who presents for continued evaluation of elevated PSA.  He was previously seen at [facility] in February 2022. Prostate exam at his visit in 2/22 demonstrated a 40 g gland without tenderness or nodularity.  A 4K test showed a PSA of 12.10 with free PSA of 19% and a 4% risk of aggressive prostate cancer on biopsy.   PSA from 09/01/2021: 16.48 He returns today for follow-up.  He continues on daily tadalafil.  His urinary symptoms are stable.  He does report urinary frequency and nocturia x2.  He feels like he is emptying his bladder well.  No further episodes of retention.  No dysuria or gross hematuria. IPSS = 7 today.  Urologic History: PSA results: 12/21: 12.05 11/16: 13.03 11/15: 9.26 11/14: 5.76  He reports a prior history of elevated PSA. He was previously evaluated by Dr. Risa Grill at [facility]. He underwent a prostate biopsy in January 2015 for a PSA of 6.54. 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 He reports a persistently elevated PSA into the 12-13 range since that time. He has a history of hematospermia.  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 He was not having any current symptoms related to this.  He has a history of lower urinary tract symptoms including frequency, urgency, decreased force of stream and nocturia. He has been  on tadalafil 5 mg daily for ED and noticed improvement in his urinary symptoms as well. No dysuria or gross hematuria. AUA score = 10. He had an episode of urinary retention and was seen in the emergency room in March 2022.  Portions of the above documentation were copied from a prior visit for review purposes only.  Allergies: Allergies  Allergen Reactions   Augmentin [Amoxicillin-Pot Clavulanate] Diarrhea and Nausea And Vomiting    PMH: Past Medical History:  Diagnosis Date   Allergy    CATS and PLANTS   ED (erectile dysfunction)    Elevated PSA    Spermatocele     PSH: History reviewed. No pertinent surgical history.  SH: Social History   Tobacco Use   Smoking status: Never   Smokeless tobacco: Never  Substance Use Topics   Alcohol use: Yes    Alcohol/week: 0.0 standard drinks    Comment: BEER orWINE   Drug use: No    ROS: Constitutional:  Negative for fever, chills, weight loss CV: Negative for chest pain, previous MI, hypertension Respiratory:  Negative for shortness of breath, wheezing, sleep apnea, frequent cough GI:  Negative for nausea, vomiting, bloody stool, GERD  PE: BP (!) 146/88 (BP Location: Right Arm)    Pulse 80    Wt 190 lb (86.2 kg)    BMI 25.07 kg/m  GENERAL APPEARANCE:  Well appearing, well developed, well nourished, NAD HEENT:  Atraumatic, normocephalic, oropharynx clear NECK:  Supple without lymphadenopathy or thyromegaly ABDOMEN:  Soft,  non-tender, no masses EXTREMITIES:  Moves all extremities well, without clubbing, cyanosis, or edema NEUROLOGIC:  Alert and oriented x 3, normal gait, CN II-XII grossly intact MENTAL STATUS:  appropriate BACK:  Non-tender to palpation, No CVAT SKIN:  Warm, dry, and intact GU: Prostate: 40 g, NT, no nodules Rectum: Normal tone,  no masses or tenderness   Results: U/A dipstick negative

## 2021-10-10 ENCOUNTER — Other Ambulatory Visit: Payer: Self-pay | Admitting: Urology

## 2021-10-14 ENCOUNTER — Other Ambulatory Visit: Payer: Self-pay | Admitting: Urology

## 2021-10-14 ENCOUNTER — Telehealth: Payer: Self-pay

## 2021-10-14 DIAGNOSIS — R972 Elevated prostate specific antigen [PSA]: Secondary | ICD-10-CM

## 2021-10-14 NOTE — Telephone Encounter (Signed)
Patient called office today for [facility] Test results.  Message sent to provider

## 2021-10-26 HISTORY — PX: SHOULDER SURGERY: SHX246

## 2021-11-06 ENCOUNTER — Ambulatory Visit
Admission: RE | Admit: 2021-11-06 | Discharge: 2021-11-06 | Disposition: A | Discharge status: Home / Self Care | Payer: PPO | Source: Ambulatory Visit | Attending: Urology | Admitting: Urology

## 2021-11-06 ENCOUNTER — Other Ambulatory Visit: Payer: Self-pay

## 2021-11-06 DIAGNOSIS — R972 Elevated prostate specific antigen [PSA]: Secondary | ICD-10-CM

## 2021-11-06 MED ORDER — GADOBENATE DIMEGLUMINE 529 MG/ML IV SOLN
18.0000 mL | Freq: Once | INTRAVENOUS | Status: AC | PRN
  Administered 2021-11-06: 18 mL via INTRAVENOUS

## 2021-11-07 ENCOUNTER — Telehealth: Payer: Self-pay | Admitting: Urology

## 2021-11-07 ENCOUNTER — Encounter: Payer: Self-pay | Admitting: Urology

## 2021-11-07 NOTE — Telephone Encounter (Signed)
Patient called the office today requesting MRI results. ?

## 2021-11-08 ENCOUNTER — Other Ambulatory Visit: Payer: Self-pay | Admitting: Urology

## 2021-11-08 ENCOUNTER — Telehealth: Payer: Self-pay | Admitting: Urology

## 2021-11-08 DIAGNOSIS — R972 Elevated prostate specific antigen [PSA]: Secondary | ICD-10-CM

## 2021-11-08 NOTE — Telephone Encounter (Signed)
Patient called the office and stated that he would like to proceed with the fusion biopsy at [facility]. ? ?Please place the referral. ?

## 2021-11-29 ENCOUNTER — Telehealth: Payer: Self-pay | Admitting: Urology

## 2021-11-29 NOTE — Telephone Encounter (Signed)
Results of fusion guided prostate biopsy from 11/23/2021 discussed with the patient.  He had a PI-RADS 3 lesion in the right lateral mid gland transition zone that was biopsied as well as other areas of the prostate.  Pathology showed benign prostate tissue in all specimens.  No evidence of malignancy.  He has done well since the biopsy.  I discussed these results with the patient in detail by phone. ?Recommend follow-up in 6 months for monitoring of his PSA. ?I discussed addition of a 5 alpha reductase inhibitor for management of his BPH.  He would like to consider this option at this time. ?

## 2022-05-09 ENCOUNTER — Other Ambulatory Visit: Payer: Self-pay

## 2022-05-09 DIAGNOSIS — R972 Elevated prostate specific antigen [PSA]: Secondary | ICD-10-CM

## 2022-06-29 ENCOUNTER — Ambulatory Visit: Payer: PPO | Admitting: Urology

## 2022-07-05 ENCOUNTER — Ambulatory Visit: Payer: PPO | Admitting: Urology

## 2022-07-13 ENCOUNTER — Encounter: Payer: Self-pay | Admitting: Urology

## 2022-07-13 ENCOUNTER — Ambulatory Visit (INDEPENDENT_AMBULATORY_CARE_PROVIDER_SITE_OTHER): Payer: PPO | Admitting: Urology

## 2022-07-13 VITALS — BP 150/86 | HR 70 | Ht 73.0 in | Wt 193.0 lb

## 2022-07-13 DIAGNOSIS — R972 Elevated prostate specific antigen [PSA]: Secondary | ICD-10-CM | POA: Diagnosis not present

## 2022-07-13 DIAGNOSIS — N401 Enlarged prostate with lower urinary tract symptoms: Secondary | ICD-10-CM | POA: Diagnosis not present

## 2022-07-13 DIAGNOSIS — N138 Other obstructive and reflux uropathy: Secondary | ICD-10-CM

## 2022-07-13 DIAGNOSIS — N529 Male erectile dysfunction, unspecified: Secondary | ICD-10-CM | POA: Diagnosis not present

## 2022-07-13 LAB — URINALYSIS, ROUTINE W REFLEX MICROSCOPIC
Bilirubin, UA: NEGATIVE
Glucose, UA: NEGATIVE
Ketones, UA: NEGATIVE
Leukocytes,UA: NEGATIVE
Nitrite, UA: NEGATIVE
Protein,UA: NEGATIVE
RBC, UA: NEGATIVE
Specific Gravity, UA: 1.015 (ref 1.005–1.030)
Urobilinogen, Ur: 0.2 mg/dL (ref 0.2–1.0)
pH, UA: 6.5 (ref 5.0–7.5)

## 2022-07-13 MED ORDER — SILDENAFIL CITRATE 100 MG PO TABS: 100.0000 mg | ORAL_TABLET | Freq: Every day | ORAL | 2 refills | Status: DC | PRN

## 2022-07-13 NOTE — Progress Notes (Signed)
Assessment: 1. Elevated PSA; negative fusion guided biopsy 3/23   2. BPH with obstruction/lower urinary tract symptoms   3. Organic impotence    Plan: Continue daily tadalafil. Prescription for sildenafil 100 mg as needed provided. Free and total PSA today. Return to office in 6 months.  Chief Complaint: Chief Complaint  Patient presents with   Elevated PSA   Benign Prostatic Hypertrophy    HPI: Shane Patterson is a 66 y.o. male who presents for continued evaluation of elevated PSA.  He was previously seen at [facility] in February 2022. Prostate exam at his visit in 2/22 demonstrated a 40 g gland without tenderness or nodularity.  A 4K test showed a PSA of 12.10 with free PSA of 19% and a 4% risk of aggressive prostate cancer on biopsy.   PSA from 09/01/2021: 16.48 At his visit in 2/23, he continued on daily tadalafil.  His urinary symptoms were stable.  He reported urinary frequency and nocturia x2.  No further episodes of retention.  No dysuria or gross hematuria. IPSS = 7. ExoDx was 17.33 indicating elevated risk of aggressive prostate cancer. Prostate MRI demonstrated a 1.2 cm nodule in the right lateral and mid gland consistent with a PI-RADS 3 lesion.  He underwent fusion guided biopsy on 11/23/2021.  Prostate volume measured 195 g.  Pathology showed benign prostate tissue with no evidence of malignancy.  He today for follow-up.  His urinary symptoms have improved with use of daily tadalafil.  He is not taking this on a regular basis.  He has noted decreased frequency and nocturia.  No dysuria or gross hematuria. IPSS = 6 today. He continues to have some problems achieving and maintaining his erections.  This is despite the daily tadalafil.  He has previously used sildenafil on an as-needed basis and would like to try this again. He also discussed a decrease in his ejaculatory volume.  No pain or discomfort.  He has normal sensation with ejaculation.  Urologic History: PSA  results: 12/21: 12.05 11/16: 13.03 11/15: 9.26 11/14: 5.76  He reports a prior history of elevated PSA. He was previously evaluated by Dr. Isabel Caprice at [facility]. He underwent a prostate biopsy in January 2015 for a PSA of 6.54. 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 He reports a persistently elevated PSA into the 12-13 range since that time. He has a history of hematospermia.  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 He was not having any current symptoms related to this.  He has a history of lower urinary tract symptoms including frequency, urgency, decreased force of stream and nocturia. He has been on tadalafil 5 mg daily for ED and noticed improvement in his urinary symptoms as well. No dysuria or gross hematuria. AUA score = 10. He had an episode of urinary retention and was seen in the emergency room in March 2022.  Portions of the above documentation were copied from a prior visit for review purposes only.  Allergies: Allergies  Allergen Reactions   Augmentin [Amoxicillin-Pot Clavulanate] Diarrhea and Nausea And Vomiting    PMH: Past Medical History:  Diagnosis Date   Allergy    CATS and PLANTS   ED (erectile dysfunction)    Elevated PSA    Spermatocele     PSH: No past surgical history on file.  SH: Social History   Tobacco Use   Smoking status: Never   Smokeless tobacco: Never  Substance Use Topics   Alcohol use: Yes    Alcohol/week: 0.0 standard drinks of alcohol    Comment: BEER orWINE   Drug use: No    ROS: Constitutional:  Negative for fever, chills, weight loss CV: Negative for chest pain, previous MI, hypertension Respiratory:  Negative for shortness of breath, wheezing, sleep apnea, frequent cough GI:  Negative for nausea, vomiting, bloody stool, GERD  PE: BP (!) 150/86   Pulse 70   Ht 6\' 1"  (1.854 m)   Wt 193 lb (87.5 kg)   BMI 25.46 kg/m  GENERAL  APPEARANCE:  Well appearing, well developed, well nourished, NAD HEENT:  Atraumatic, normocephalic, oropharynx clear NECK:  Supple without lymphadenopathy or thyromegaly ABDOMEN:  Soft, non-tender, no masses EXTREMITIES:  Moves all extremities well, without clubbing, cyanosis, or edema NEUROLOGIC:  Alert and oriented x 3, normal gait, CN II-XII grossly intact MENTAL STATUS:  appropriate BACK:  Non-tender to palpation, No CVAT SKIN:  Warm, dry, and intact GU: Prostate: >60 g, NT, no nodules Rectum: Normal tone,  no masses or tenderness   Results: U/A dipstick negative

## 2022-07-14 ENCOUNTER — Telehealth: Payer: Self-pay

## 2022-07-14 LAB — PSA, TOTAL AND FREE
PSA, Free Pct: 16.1 %
PSA, Free: 2.65 ng/mL
Prostate Specific Ag, Serum: 16.5 ng/mL — ABNORMAL HIGH (ref 0.0–4.0)

## 2022-07-14 NOTE — Telephone Encounter (Signed)
Patient informed of MD response to results and voiced understanding.

## 2022-07-14 NOTE — Telephone Encounter (Signed)
-----   Message from Milderd Meager, MD sent at 07/14/2022  1:52 PM EST ----- Please notify the patient that his PSA remains elevated but stable.   Recommend continued monitoring with follow-up in 6 months.

## 2022-11-14 ENCOUNTER — Telehealth: Payer: Self-pay | Admitting: Urology

## 2022-11-14 ENCOUNTER — Encounter: Payer: Self-pay | Admitting: Urology

## 2022-11-14 ENCOUNTER — Ambulatory Visit: Payer: Medicare HMO | Admitting: Urology

## 2022-11-14 VITALS — BP 137/79 | HR 81 | Ht 73.0 in | Wt 198.0 lb

## 2022-11-14 DIAGNOSIS — R972 Elevated prostate specific antigen [PSA]: Secondary | ICD-10-CM | POA: Diagnosis not present

## 2022-11-14 DIAGNOSIS — R339 Retention of urine, unspecified: Secondary | ICD-10-CM

## 2022-11-14 DIAGNOSIS — N401 Enlarged prostate with lower urinary tract symptoms: Secondary | ICD-10-CM | POA: Diagnosis not present

## 2022-11-14 DIAGNOSIS — N138 Other obstructive and reflux uropathy: Secondary | ICD-10-CM

## 2022-11-14 MED ORDER — TAMSULOSIN HCL 0.4 MG PO CAPS: 0.4000 mg | ORAL_CAPSULE | Freq: Every day | ORAL | 0 refills | Status: DC

## 2022-11-14 NOTE — Progress Notes (Signed)
Simple Catheter Placement  Due to urinary retention patient is present today for a foley cath placement.  Patient was cleaned and prepped in a sterile fashion with betadine and 2% lidocaine jelly was instilled into the urethra. A 16 FR coude foley catheter was inserted, urine return was noted  780 ml, urine was yellow in color.  The balloon was filled with 10cc of sterile water.  A leg bag was attached for drainage. Patient was also given a night bag to take home and was given instruction on how to change from one bag to another.  Patient was given instruction on proper catheter care.  Patient tolerated well, no complications were noted   Performed by: Cierrah Dace H RMA  Additional notes/ Follow up: Cysto and start Tamsulosin

## 2022-11-14 NOTE — Patient Instructions (Addendum)
Cystoscopy Cystoscopy is a procedure that is used to help diagnose and sometimes treat conditions that affect the lower urinary tract. The lower urinary tract includes the bladder and the urethra. The urethra is the tube that drains urine from the bladder. Cystoscopy is done using a thin, tube-shaped instrument with a light and camera at the end (cystoscope). The cystoscope may be hard or flexible, depending on the goal of the procedure. The cystoscope is inserted through the urethra, into the bladder. Cystoscopy may be recommended if you have: Urinary tract infections that keep coming back. Blood in the urine (hematuria). An inability to control when you urinate (urinary incontinence) or an overactive bladder. Unusual cells found in a urine sample. A blockage in the urethra, such as a urinary stone. Painful urination. An abnormality in the bladder found during an intravenous pyelogram (IVP) or CT scan. What are the risks? Generally, this is a safe procedure. However, problems may occur, including: Infection. Bleeding.  What happens during the procedure?  You will be given one or more of the following: A medicine to numb the area (local anesthetic). The area around the opening of your urethra will be cleaned. The cystoscope will be passed through your urethra into your bladder. Germ-free (sterile) fluid will flow through the cystoscope to fill your bladder. The fluid will stretch your bladder so that your health care provider can clearly examine your bladder walls. Your doctor will look at the urethra and bladder. The cystoscope will be removed The procedure may vary among health care providers  What can I expect after the procedure? After the procedure, it is common to have: Some soreness or pain in your urethra. Urinary symptoms. These include: Mild pain or burning when you urinate. Pain should stop within a few minutes after you urinate. This may last for up to a few days  after the procedure. A small amount of blood in your urine for several days. Feeling like you need to urinate but producing only a small amount of urine. Follow these instructions at home: General instructions Return to your normal activities as told by your health care provider.  Drink plenty of fluids after the procedure. Keep all follow-up visits as told by your health care provider. This is important. Contact a health care provider if you: Have pain that gets worse or does not get better with medicine, especially pain when you urinate lasting longer than 72 hours after the procedure. Have trouble urinating. Get help right away if you: Have blood clots in your urine. Have a fever or chills. Are unable to urinate. Summary Cystoscopy is a procedure that is used to help diagnose and sometimes treat conditions that affect the lower urinary tract. Cystoscopy is done using a thin, tube-shaped instrument with a light and camera at the end. After the procedure, it is common to have some soreness or pain in your urethra. It is normal to have blood in your urine after the procedure.  If you were prescribed an antibiotic medicine, take it as told by your health care provider.  This information is not intended to replace advice given to you by your health care provider. Make sure you discuss any questions you have with your health care provider. Document Revised: 08/06/2018 Document Reviewed: 08/06/2018 Elsevier Patient Education  [employer]. Finasteride Tablets (BPH)  What is this medication? FINASTERIDE (fi NAS teer ide) treats the symptoms of an enlarged prostate (benign prostatic hyperplasia). It works by decreasing the size  of the prostate. It belongs to a group of medications called 5-alpha reductase inhibitors. This medicine may be used for other purposes; ask your health care provider or pharmacist if you have questions. COMMON BRAND NAME(S): Proscar What should I tell my care  team before I take this medication? They need to know if you have any of these conditions: Liver disease An unusual or allergic reaction to finasteride, other medications, foods, dyes, or preservatives Pregnant or trying to get pregnant Breast-feeding How should I use this medication? Take this medication by mouth with water. Take it as directed on the prescription label at the same time every day. You can take it with or without food. If it upsets your stomach, take it with food. Keep taking it unless your care team tells you to stop. Talk to your care team about the use of this medication in children. Special care may be needed. Overdosage: If you think you have taken too much of this medicine contact a poison control center or emergency room at once. NOTE: This medicine is only for you. Do not share this medicine with others. What if I miss a dose? If you miss a dose, take it as soon as you can. If it is almost time for your next dose, take only that dose. Do not take double or extra doses. What may interact with this medication? Saw palmetto or other dietary supplements This list may not describe all possible interactions. Give your health care provider a list of all the medicines, herbs, non-prescription drugs, or dietary supplements you use. Also tell them if you smoke, drink alcohol, or use illegal drugs. Some items may interact with your medicine. What should I watch for while using this medication? Visit your care team for regular checks on your progress. It may be some time before you see the benefit from this medication. You may need blood work while taking this medication. For example, your care team may have you take a blood test called PSA for the screening of prostate cancer. Make sure your care team knows you are taking this medication before you take a PSA test. Talk to your care team about your risk of cancer. You may be more at risk for certain types of cancer if you take this  medication. Do not donate blood while you are taking this medication. Donated blood may contain enough of this medication to cause birth defects in someone who is pregnant. Ask your care team when it is safe to donate blood after you stop taking this medication. This medication can cause serious birth defects. If you are pregnant or may get pregnant, do not handle broken or crushed tablets of this medication. If you are pregnant and come into contact with broken or crushed tablets, contact your care team. Exposure to whole tablets is not expected to cause harm as long as they are not swallowed. What side effects may I notice from receiving this medication? Side effects that you should report to your care team as soon as possible: Allergic reactions--skin rash, itching, hives, swelling of the face, lips, tongue, or throat Breast tissue changes, new lumps, redness, pain, or discharge from the nipple Side effects that usually do not require medical attention (report to your care team if they continue or are bothersome): Breast pain or tenderness Change in sex drive or performance This list may not describe all possible side effects. Call your doctor for medical advice about side effects. You may report side effects to FDA  at 1-800-FDA-1088. Where should I keep my medication? Keep out of the reach of children and pets. Store at room temperature below 30 degrees C (86 degrees F). Protect from light. Keep the container tightly closed. Get rid of any unused medication after the expiration date. To get rid of medications that are no longer needed or have expired: Take the medication to a medication take-back program. Check with your pharmacy or law enforcement to find a location. If you cannot return the medication, ask your pharmacist or care team how to get rid of this medication safely.  Prostatic artery embolization is performed through a small puncture in the groin. A catheter is inserted through the  artery and directed toward the prostate. Once the catheter is positioned in the artery supplying blood to the prostate, tiny particles are injected that plug up the artery, blocking blood flow.  Holmium Laser Enucleation of the Prostate (HoLEP)  HoLEP is a treatment for men with benign prostatic hyperplasia (BPH). The laser surgery removed blockages of urine flow, and is done without any incisions on the body.     What is HoLEP?  HoLEP is a type of laser surgery used to treat obstruction (blockage) of urine flow as a result of benign prostatic hyperplasia (BPH). In men with BPH, the prostate gland is not cancerous, but has become enlarged. An enlarged prostate can result in a number of urinary tract symptoms such as weak urinary stream, difficulty in starting urination, inability to urinate, frequent urination, or getting up at night to urinate.  HoLEP was developed in the 1990's as a more effective and less expensive surgical option for BPH, compared to other surgical options such as laser vaporization(PVP/greenlight laser), transurethral resection of the prostate(TURP), and open simple prostatectomy.   What happens during a HoLEP?  HoLEP requires general anesthesia ("asleep" throughout the procedure).   An antibiotic is given to reduce the risk of infection  A surgical instrument called a resectoscope is inserted through the urethra (the tube that carries urine from the bladder). The resectoscope has a camera that allows the surgeon to view the internal structure of the prostate gland, and to see where the incisions are being made during surgery.  The laser is inserted into the resectoscope and is used to enucleate (free up) the enlarged prostate tissue from the capsule (outer shell) and then to seal up any blood vessels. The tissue that has been removed is pushed back into the bladder.  A morcellator is placed through the resectoscope, and is used to suction out the prostate tissue that has  been pushed into the bladder.  When the prostate tissue has been removed, the resectoscope is removed, and a foley catheter is placed to allow healing and drain the urine from the bladder.     What happens after a HoLEP?  More than 90% of patients go home the same day a few hours after surgery. Less than 10% will be admitted to the hospital overnight for observation to monitor the urine, or if they have other medical problems.  Fluid is flushed through the catheter for about 1 hour after surgery to clear any blood from the urine. It is normal to have some blood in the urine after surgery. The need for blood transfusion is extremely rare.  Eating and drinking are permitted after the procedure once the patient has fully awakened from anesthesia.  The catheter is usually removed 2-3 days after surgery- the patient will come to clinic to have the catheter  removed and make sure they can urinate on their own.  It is very important to drink lots of fluids after surgery for one week to keep the bladder flushed.  At first, there may be some burning with urination, but this typically improved within a few hours to days. Most patients do not have a significant amount of pain, and narcotic pain medications are rarely needed.  Symptoms of urinary frequency, urgency, and even leakage are NORMAL for the first few weeks after surgery as the bladder adjusts after having to work hard against blockage from the prostate for many years. This will improve, but can sometimes take several months.  The use of pelvic floor exercises (Kegel exercises) can help improve problems with urinary incontinence.   After catheter removal, patients will be seen at 6 weeks and 6 months for symptom check  No heavy lifting for at least 2-3 weeks after surgery, however patients can walk and do light activities the first day after surgery. Return to work time depends on occupation.    What are the advantages of HoLEP?  HoLEP  has been studied in many different parts of the world and has been shown to be a safe and effective procedure. Although there are many types of BPH surgeries available, HoLEP offers a unique advantage in being able to remove a large amount of tissue without any incisions on the body, even in very large prostates, while decreasing the risk of bleeding and providing tissue for pathology (to look for cancer). This decreases the need for blood transfusions during surgery, minimizes hospital stay, and reduces the risk of needing repeat treatment.  What are the side effects of HoLEP?  Temporary burning and bleeding during urination. Some blood may be seen in the urine for weeks after surgery and is part of the healing process.  Urinary incontinence (inability to control urine flow) is expected in all patients immediately after surgery and they should wear pads for the first few days/weeks. This typically improves over the course of several weeks. Performing Kegel exercises can help decrease leakage from stress maneuvers such as coughing, sneezing, or lifting. The rate of long term leakage is very low. Patients may also have leakage with urgency and this may be treated with medication. The risk of urge incontinence can be dependent on several factors including age, prostate size, symptoms, and other medical problems.  Retrograde ejaculation or "backwards ejaculation." In 75% of cases, the patient will not see any fluid during ejaculation after surgery.  Erectile function is generally not significantly affected.   What are the risks of HoLEP?  Injury to the urethra or development of scar tissue at a later date  Injury to the capsule of the prostate (typically treated with longer catheterization).  Injury to the bladder or ureteral orifices (where the urine from the kidney drains out)  Infection of the bladder, testes, or kidneys  Return of urinary obstruction at a later date requiring another operation  (<2%)  Need for blood transfusion or re-operation due to bleeding  Failure to relieve all symptoms and/or need for prolonged catheterization after surgery  5-15% of patients are found to have previously undiagnosed prostate cancer in their specimen. Prostate cancer can be treated after HoLEP.  Standard risks of anesthesia including blood clots, heart attacks, etc  When should I call my doctor?  Fever over 101.3 degrees  Inability to urinate, or large blood clots in the urine

## 2022-11-14 NOTE — Progress Notes (Signed)
I,Amy L Pierron,acting as a scribe for Shane Espy, MD.,have documented all relevant documentation on the behalf of Shane Espy, MD,as directed by  Shane Espy, MD while in the presence of Shane Espy, MD.  11/14/2022 10:12 AM   Shane Patterson 08-16-1956 [id number]   Chief Complaint  Patient presents with   Establish Care   Urinary Retention    Cath removal    HPI:  67 year-old male who presents today for further evaluation of urinary retention. He had a Foley catheter placed on March 13 after difficulty urinating on a cruise.   He had an episode of urinary retention in 2023 as well. He has a personal history of BPH and elevated PSA. He was previously followed by Dr. Felipa Eth but recently moved to the [facility] area and is needing to transition his care.   He had a negative prostate biopsy in 2015 done by Dr. Risa Grill.  He had several risk stratification tests including a 4K which demonstrated 4-point risk of aggressive cancer, and an endo-dx study score of 17.33 indicating an elevated risk for aggressive cancer. He had a prostate MRI which showed 1.2 cm left lateral and mid-gland nodule consistent with PI-RADS 3. He had a fusion biopsy in March of 2023 at which time his prostate volume was measured to be 195 grams. Pathology was negative for malignancy.    On September 01, 2021 his PSA was 16.48. His most recent PSA on July 13, 2022, was 16.5.  He has baseline erectile dysfunction despite using daily Tadalafil and Sildenafil as needed.   He reports being able to live with his current urinary symptoms but would not want them to worsen as he ages. The evening prior to his recent cruise he applied the Scopolamine patch. The following afternoon he experienced urinary urgency and retention. The last time he had this happen he thinks it was due to taking Viagra, but denies recalling any other possible causes. Recently he has increased his allergy medication intake.    PMH: Past  Medical History:  Diagnosis Date   Allergy    CATS and PLANTS   ED (erectile dysfunction)    Elevated PSA    Spermatocele     Home Medications:  Allergies as of 11/14/2022       Reactions   Augmentin [amoxicillin-pot Clavulanate] Diarrhea, Nausea And Vomiting        Medication List        Accurate as of November 14, 2022 10:12 AM. If you have any questions, ask your nurse or doctor.          STOP taking these medications    atorvastatin 10 MG tablet Commonly known as: LIPITOR Stopped by: Shane Espy, MD   lisinopril 5 MG tablet Commonly known as: ZESTRIL Stopped by: Shane Espy, MD       TAKE these medications    fluticasone 50 MCG/ACT nasal spray Commonly known as: FLONASE Place into both nostrils daily. What changed: Another medication with the same name was removed. Continue taking this medication, and follow the directions you see here. Changed by: Shane Espy, MD   irbesartan 75 MG tablet Commonly known as: AVAPRO Take 75 mg by mouth daily.   sildenafil 100 MG tablet Commonly known as: VIAGRA Take 1 tablet (100 mg total) by mouth daily as needed for erectile dysfunction.   tadalafil 5 MG tablet Commonly known as: CIALIS Take 5 mg by mouth daily. For BPH        Allergies:  Allergies  Allergen Reactions   Augmentin [Amoxicillin-Pot Clavulanate] Diarrhea and Nausea And Vomiting    Family History: Family History  Problem Relation Age of Onset   Diabetes Father    Diabetes Paternal Grandmother     Social History:  reports that he has never smoked. He has never used smokeless tobacco. He reports current alcohol use. He reports that he does not use drugs.   Physical Exam: BP 137/79   Pulse 81   Ht 6\' 1"  (1.854 m)   Wt 198 lb (89.8 kg)   BMI 26.12 kg/m   Constitutional:  Alert and oriented, No acute distress. HEENT: [place] AT, moist mucus membranes.  Trachea midline, no masses. Neurologic: Grossly intact, no focal deficits,  moving all 4 extremities. Psychiatric: Normal mood and affect.   Assessment & Plan:    Elevated PSA  - Continue to have checked each year.   -Status post negative biopsy x 2, likely related to prostamegaly  2. Massive BPH with urinary retention  - This is his second episode of retention.  -Fill and pull voiding trial today, ultimately able to urinate 250 of the 400 cc instilled, advised to return this afternoon if he has any issues with voiding whatsoever   - Recommend the addition of Finasteride and/or Flomax versus very strong consideration of outlet procedures such as HoLep given his recurrent issues.  We discussed the pathophysiology of BPH and sequela and long-term consequences of chronic outlet obstruction.  He is not a candidate for TURP due to the size of his prostate.   -  We discussed the common postoperative course following HoLep including need for overnight Foley catheter, temporary worsening of irritative voiding symptoms, and occasional stress incontinence which typically lasts up to 6 months but can persist.  We discussed retrograde ejaculation and damage to surrounding structures including the urinary sphincter. Other uncommon complications including hematuria and urinary tract infection. Also discussed the option of PAE.  - He is leaning towards the HoLep. He will read the information given, discuss with his wife, and call to let us know his decision.   - He was given information about pharmacotherapy versus HoLEP versus PIE today, would like to go home and discuss with his wife.  If he elects a procedure, would prefer him to have a cystoscopy preop for surgical planning purposes.  He is agreeable this plan.  If he does not like to proceed with anything, like to see him on a every 6 month basis under his urinary symptoms as well as trend his PSA.  I have reviewed the above documentation for accuracy and completeness, and I agree with the above.   Shane Espy,  MD   [facility] [address], [address]  I spent 65 total minutes on the day of the encounter including pre-visit review of the medical record, face-to-face time with the patient, and post visit ordering of labs/imaging/tests.  [place],  [id number] [phone]

## 2022-11-14 NOTE — Progress Notes (Signed)
Fill and Pull Catheter Removal  Patient is present today for a catheter removal.  Patient was cleaned and prepped in a sterile fashion 400 ml of sterile water/ saline was instilled into the bladder when the patient felt the urge to urinate. 10 ml of water was then drained from the balloon.  A 16 FR foley cath was removed from the bladder no complications were noted .  Patient as then given some time to void on their own.  Patient can void  250 ml on their own after some time.  Patient tolerated well.  Performed by: [employer] RMA

## 2022-11-14 NOTE — Addendum Note (Signed)
Addended by: Kris Mouton on: 11/14/2022 03:28 PM   Modules accepted: Orders

## 2022-11-15 ENCOUNTER — Telehealth: Payer: Self-pay | Admitting: Urology

## 2022-11-15 NOTE — Telephone Encounter (Signed)
I had planned to give him a one-time dose in the office before he left but I guess the message got lost in translation.  He was really for empiric/prophylactic reasons and not very important.  At this point,  I do not think it is necessary.  [name], MD

## 2022-11-15 NOTE — Telephone Encounter (Signed)
Patient advised.

## 2022-11-15 NOTE — Telephone Encounter (Signed)
Patient called and stated that Dr. Erlene Quan had mentioned an antibiotic yesterday at his appointment, but there was not one sent to his pharmacy. He is asking if he needs this. Please advise patient.

## 2022-11-16 ENCOUNTER — Ambulatory Visit: Payer: Medicare HMO | Admitting: Urology

## 2022-11-16 ENCOUNTER — Ambulatory Visit: Payer: Self-pay | Admitting: Urology

## 2022-11-16 ENCOUNTER — Other Ambulatory Visit: Payer: Self-pay | Admitting: Urology

## 2022-11-16 VITALS — BP 138/79 | HR 80 | Ht 73.0 in | Wt 198.0 lb

## 2022-11-16 DIAGNOSIS — N401 Enlarged prostate with lower urinary tract symptoms: Secondary | ICD-10-CM

## 2022-11-16 DIAGNOSIS — N138 Other obstructive and reflux uropathy: Secondary | ICD-10-CM

## 2022-11-16 DIAGNOSIS — R339 Retention of urine, unspecified: Secondary | ICD-10-CM

## 2022-11-16 MED ORDER — SULFAMETHOXAZOLE-TRIMETHOPRIM 800-160 MG PO TABS
1.0000 | ORAL_TABLET | Freq: Once | ORAL | Status: AC
  Administered 2022-11-16: 1 via ORAL

## 2022-11-16 NOTE — Progress Notes (Signed)
   11/16/22  CC:  Chief Complaint  Patient presents with   Cysto    HPI: 67 year old male with massive BPH and refractory urinary retention who returns today for cystoscopic evaluation.  Please see previous notes for details.  Prior to the procedure today, his Foley catheter was removed and replaced at the end with another 22 [place] coud catheter.  He reports that he did not start Flomax because he was under the impression that was redundant with daily Cialis which he is already been taking.  Blood pressure 138/79, pulse 80, height 6\' 1"  (1.854 m), weight 198 lb (89.8 kg). NED. A&Ox3.   No respiratory distress   Abd soft, NT, ND Normal phallus with bilateral descended testicles  Cystoscopy Procedure Note  Patient identification was confirmed, informed consent was obtained, and patient was prepped using Betadine solution.  Lidocaine jelly was administered per urethral meatus.     Pre-Procedure: - Inspection reveals a normal caliber ureteral meatus.  Procedure: The flexible cystoscope was introduced without difficulty - No urethral strictures/lesions are present. - Enlarged prostate trilobar coaptation - Elevated bladder neck - Bilateral ureteral orifices identified - Bladder mucosa  reveals no ulcers, tumors, or lesions - No bladder stones - Mild trabeculation  Retroflexion shows large intravesical component of the prostate including very small median lobe as well as kissing lateral lobes   Post-Procedure: - Patient tolerated the procedure well  Assessment/ Plan:  1. BPH with obstruction/lower urinary tract symptoms/ urinary retention Cystoscopic evidence of massive BPH sequela of chronic outlet obstruction  After discussion in a few days ago and a failed voiding trial, he is interested in pursuing intervention.  He is most interested in HoLEP today.   We discussed the common postoperative course following holep including need for overnight Foley catheter,  temporary worsening of irritative voiding symptoms, and occasional stress incontinence which typically lasts up to 6 months but can persist.  We discussed retrograde ejaculation and damage to surrounding structures including the urinary sphincter. Other uncommon complications including hematuria and urinary tract infection.  He understands all of the above and is willing to proceed as planned.   He will need another preop UA/urine culture to be collected early next week.  Foley catheter was replaced today.  Will likely leave the Foley catheter in place 1 week postop and then pursue voiding trial.  He is agreeable this plan. - sulfamethoxazole-trimethoprim (BACTRIM DS) 800-160 MG per tablet 1 tablet   [name], MD

## 2022-11-16 NOTE — H&P (View-Only) (Signed)
   11/16/22  CC:  Chief Complaint  Patient presents with   Cysto    HPI: 67-year-old male with massive BPH and refractory urinary retention who returns today for cystoscopic evaluation.  Please see previous notes for details.  Prior to the procedure today, his Foley catheter was removed and replaced at the end with another 16 [place] coud catheter.  He reports that he did not start Flomax because he was under the impression that was redundant with daily Cialis which he is already been taking.  Blood pressure 138/79, pulse 80, height 6' 1" (1.854 m), weight 198 lb (89.8 kg). NED. A&Ox3.   No respiratory distress   Abd soft, NT, ND Normal phallus with bilateral descended testicles  Cystoscopy Procedure Note  Patient identification was confirmed, informed consent was obtained, and patient was prepped using Betadine solution.  Lidocaine jelly was administered per urethral meatus.     Pre-Procedure: - Inspection reveals a normal caliber ureteral meatus.  Procedure: The flexible cystoscope was introduced without difficulty - No urethral strictures/lesions are present. - Enlarged prostate trilobar coaptation - Elevated bladder neck - Bilateral ureteral orifices identified - Bladder mucosa  reveals no ulcers, tumors, or lesions - No bladder stones - Mild trabeculation  Retroflexion shows large intravesical component of the prostate including very small median lobe as well as kissing lateral lobes   Post-Procedure: - Patient tolerated the procedure well  Assessment/ Plan:  1. BPH with obstruction/lower urinary tract symptoms/ urinary retention Cystoscopic evidence of massive BPH sequela of chronic outlet obstruction  After discussion in a few days ago and a failed voiding trial, he is interested in pursuing intervention.  He is most interested in HoLEP today.   We discussed the common postoperative course following holep including need for overnight Foley catheter,  temporary worsening of irritative voiding symptoms, and occasional stress incontinence which typically lasts up to 6 months but can persist.  We discussed retrograde ejaculation and damage to surrounding structures including the urinary sphincter. Other uncommon complications including hematuria and urinary tract infection.  He understands all of the above and is willing to proceed as planned.   He will need another preop UA/urine culture to be collected early next week.  Foley catheter was replaced today.  Will likely leave the Foley catheter in place 1 week postop and then pursue voiding trial.  He is agreeable this plan. - sulfamethoxazole-trimethoprim (BACTRIM DS) 800-160 MG per tablet 1 tablet   Kennady Zimmerle, MD 

## 2022-11-16 NOTE — Progress Notes (Signed)
Surgical Physician Order Form [facility] [place]  * Scheduling expectation : Next Available  *Length of Case:   *Clearance needed: no  *Anticoagulation Instructions: Hold all anticoagulants  *Aspirin Instructions: Hold Aspirin  *Post-op visit Date/Instructions:  1 week voiding trial  *Diagnosis: BPH w/urinary obstruction  *Procedure:     HOLEP [id number])   Additional orders: N/A  -Admit type: OUTpatient  -Anesthesia: General  -VTE Prophylaxis Standing Order SCD's       Other:   -Standing Lab Orders Per Anesthesia    Lab other: UA&Urine Culture  -Standing Test orders EKG/Chest x-ray per Anesthesia       Test other:   - Medications:  Ancef 2gm IV   ok with allergy  -Other orders:  N/A

## 2022-11-17 ENCOUNTER — Telehealth: Payer: Self-pay

## 2022-11-17 NOTE — Progress Notes (Signed)
   [facility]  Surgery Date: Date: 11/27/2022  Surgeon: Dr. [name], MD  Inpt ( No  )   Outpt (Yes)   Obs ( No  )   Diagnosis: N40.1, N13.8 Benign Prostatic Hyperplasia with Urinary Obstruction  -CPT: [phone]  Surgery: Holmium Laser Enucleation of the Prostate  Stop Anticoagulations: Yes and also hold ASA  Cardiac/Medical/Pulmonary Clearance needed: no  *Orders entered into EPIC  Date: 11/17/22   *Case booked in [place]  Date: 11/16/2022  *Notified pt of Surgery: Date: 11/16/2022  PRE-OP UA & CX: yes, will obtain in clinic on 11/20/2022  *Placed into Prior Authorization Work Fabio Bering Date: 11/17/22  Assistant/laser/rep:No

## 2022-11-17 NOTE — Telephone Encounter (Signed)
I spoke with Shane Patterson. We have discussed possible surgery dates and Monday April 1st, 2024 was agreed upon by all parties. Patient given information about surgery date, what to expect pre-operatively and post operatively.  We discussed that a Pre-Admission Testing office will be calling to set up the pre-op visit that will take place prior to surgery, and that these appointments are typically done over the phone with a Pre-Admissions RN. Informed patient that our office will communicate any additional care to be provided after surgery. Patients questions or concerns were discussed during our call. Advised to call our office should there be any additional information, questions or concerns that arise. Patient verbalized understanding.

## 2022-11-20 ENCOUNTER — Ambulatory Visit: Payer: Medicare HMO | Admitting: Physician Assistant

## 2022-11-20 ENCOUNTER — Encounter: Payer: Self-pay | Admitting: Physician Assistant

## 2022-11-20 ENCOUNTER — Telehealth: Payer: Self-pay

## 2022-11-20 DIAGNOSIS — N401 Enlarged prostate with lower urinary tract symptoms: Secondary | ICD-10-CM

## 2022-11-20 DIAGNOSIS — N138 Other obstructive and reflux uropathy: Secondary | ICD-10-CM

## 2022-11-20 LAB — URINALYSIS, COMPLETE
Bilirubin, UA: NEGATIVE
Glucose, UA: NEGATIVE
Ketones, UA: NEGATIVE
Nitrite, UA: POSITIVE — AB
Protein,UA: NEGATIVE
Specific Gravity, UA: <1.005 — ABNORMAL LOW (ref 1.005–1.030)
Urobilinogen, Ur: 0.2 mg/dL (ref 0.2–1.0)
pH, UA: 5 (ref 5.0–7.5)

## 2022-11-20 LAB — MICROSCOPIC EXAMINATION

## 2022-11-20 NOTE — Telephone Encounter (Signed)
Giselle from [employer] called and left message stating patient called them to check on authorization for his surgery scheduled on 11/27/22 but they did not get any requests for this and they wanted to call us about this and asked [place] to call patient to update on status of this.

## 2022-11-20 NOTE — Progress Notes (Signed)
Plugged cath and got a urine sample for lab.

## 2022-11-21 ENCOUNTER — Encounter
Admission: RE | Admit: 2022-11-21 | Discharge: 2022-11-21 | Disposition: A | Discharge status: Home / Self Care | Payer: Medicare HMO | Source: Ambulatory Visit | Attending: Urology | Admitting: Urology

## 2022-11-21 ENCOUNTER — Other Ambulatory Visit: Payer: Self-pay

## 2022-11-21 VITALS — Ht 73.0 in | Wt 195.0 lb

## 2022-11-21 DIAGNOSIS — I10 Essential (primary) hypertension: Secondary | ICD-10-CM

## 2022-11-21 HISTORY — DX: Benign prostatic hyperplasia without lower urinary tract symptoms: N40.0

## 2022-11-21 HISTORY — DX: Other complications of anesthesia, initial encounter: T88.59XA

## 2022-11-21 HISTORY — DX: Prediabetes: R73.03

## 2022-11-21 HISTORY — DX: Essential (primary) hypertension: I10

## 2022-11-21 NOTE — Patient Instructions (Addendum)
Your procedure is scheduled on: Monday November 27, 2022. Report to the Registration Desk on the 1st floor of the [facility]. To find out your arrival time, please call [phone] between 1PM - 3PM on: Friday November 24, 2022. If your arrival time is 6:00 am, do not arrive before that time as the [facility] entrance doors do not open until 6:00 am.  REMEMBER: Instructions that are not followed completely may result in serious medical risk, up to and including death; or upon the discretion of your surgeon and anesthesiologist your surgery may need to be rescheduled.  Do not eat food or drink fluids after midnight the night before surgery.  No gum chewing or hard candies.   One week prior to surgery: Stop Anti-inflammatories (NSAIDS) such as Advil, Aleve, Ibuprofen, Motrin, Naproxen, Naprosyn and Aspirin based products such as Excedrin, Goody's Powder, BC Powder. Stop ANY OVER THE COUNTER supplements until after surgery. You may however, continue to take Tylenol if needed for pain up until the day of surgery.  Continue taking all prescribed medications with the exception of the following: Stop sildenafil (VIAGRA) 100 MG 2 days prior to surgery  Stop tadalafil (CIALIS) 5 MG 2 days prior to surgery olive oil external oil   Follow recommendations from Cardiologist or PCP regarding stopping blood thinners.  TAKE ONLY THESE MEDICATIONS THE MORNING OF SURGERY WITH A SIP OF WATER:   fluticasone (FLONASE) 50 MCG/ACT nasal spray   Antacid (take one the night before and one on the morning of surgery - helps to prevent nausea after surgery.)  Use inhalers on the day of surgery and bring to the hospital.  Fleets enema or bowel prep as directed.  No Alcohol for 24 hours before or after surgery.  No Smoking including e-cigarettes for 24 hours before surgery.  No chewable tobacco products for at least 6 hours before surgery.  No nicotine patches on the day of surgery.  Do not use any  "recreational" drugs for at least a week (preferably 2 weeks) before your surgery.  Please be advised that the combination of cocaine and anesthesia may have negative outcomes, up to and including death. If you test positive for cocaine, your surgery will be cancelled.  On the morning of surgery brush your teeth with toothpaste and water, you may rinse your mouth with mouthwash if you wish. Do not swallow any toothpaste or mouthwash.  Use CHG Soap or wipes as directed on instruction sheet.  Do not wear jewelry, make-up, hairpins, clips or nail polish.  Do not wear lotions, powders, or perfumes.   Do not shave body hair from the neck down 48 hours before surgery.  Contact lenses, hearing aids and dentures may not be worn into surgery.  Do not bring valuables to the hospital. [facility] is not responsible for any missing/lost belongings or valuables.   Total Shoulder Arthroplasty:  use Benzoyl Peroxide 5% Gel as directed on instruction sheet.  Bring your C-PAP to the hospital in case you may have to spend the night.   Notify your doctor if there is any change in your medical condition (cold, fever, infection).  Wear comfortable clothing (specific to your surgery type) to the hospital.  After surgery, you can help prevent lung complications by doing breathing exercises.  Take deep breaths and cough every 1-2 hours. Your doctor may order a device called an Incentive Spirometer to help you take deep breaths. When coughing or sneezing, hold a pillow firmly against your incision with  both hands. This is called "splinting." Doing this helps protect your incision. It also decreases belly discomfort.  If you are being admitted to the hospital overnight, leave your suitcase in the car. After surgery it may be brought to your room.  In case of increased patient census, it may be necessary for you, the patient, to continue your postoperative care in the Same Day Surgery department.  If you are  being discharged the day of surgery, you will not be allowed to drive home. You will need a responsible individual to drive you home and stay with you for 24 hours after surgery.   If you are taking public transportation, you will need to have a responsible individual with you.  Please call the [facility] Dept. at [phone] if you have any questions about these instructions.  Surgery Visitation Policy:  Patients having surgery or a procedure may have two visitors.  Children under the age of 53 must have an adult with them who is not the patient.  Inpatient Visitation:    Visiting hours are 7 a.m. to 8 p.m. Up to four visitors are allowed at one time in a patient room. The visitors may rotate out with other people during the day.  One visitor age 63 or older may stay with the patient overnight and must be in the room by 8 p.m.

## 2022-11-21 NOTE — Telephone Encounter (Signed)
AETNA: No Authorization Required Place of Service [facility]  Service From - To Date NA  Admission Type 9  Diagnosis Code 1 N401 - Benign prostatic hyperplasia with lower urinary tract symp  Diagnosis Code 2 N138 - Other obstructive and reflux uropathy  Procedure Code 1 52649  Quantity 1 Days  Procedure From - To Date 2022-11-27  Status NO AUTH REQUIRED  Message No precert required. The requested service may not be eligible for coverage . Refer to the online Clinical Policy Bulletins or the Provider Code Search Tool on [employer]. You may also contact provider services using the Precert number on the member id card. [id number]  Per [employer].   I have called patient and left a detailed message. Patient advised of CPT code if he would like to call and also verify.

## 2022-11-22 LAB — CULTURE, URINE COMPREHENSIVE

## 2022-11-23 ENCOUNTER — Telehealth: Payer: Self-pay | Admitting: Physician Assistant

## 2022-11-23 ENCOUNTER — Encounter
Admission: RE | Admit: 2022-11-23 | Discharge: 2022-11-23 | Disposition: A | Discharge status: Home / Self Care | Payer: Medicare HMO | Source: Ambulatory Visit | Attending: Urology | Admitting: Urology

## 2022-11-23 DIAGNOSIS — R7303 Prediabetes: Secondary | ICD-10-CM | POA: Insufficient documentation

## 2022-11-23 DIAGNOSIS — I447 Left bundle-branch block, unspecified: Secondary | ICD-10-CM

## 2022-11-23 DIAGNOSIS — R9431 Abnormal electrocardiogram [ECG] [EKG]: Secondary | ICD-10-CM

## 2022-11-23 DIAGNOSIS — Z01810 Encounter for preprocedural cardiovascular examination: Secondary | ICD-10-CM | POA: Insufficient documentation

## 2022-11-23 DIAGNOSIS — I10 Essential (primary) hypertension: Secondary | ICD-10-CM | POA: Diagnosis not present

## 2022-11-23 DIAGNOSIS — N401 Enlarged prostate with lower urinary tract symptoms: Secondary | ICD-10-CM | POA: Insufficient documentation

## 2022-11-23 HISTORY — DX: Left bundle-branch block, unspecified: I44.7

## 2022-11-23 MED ORDER — CIPROFLOXACIN HCL 250 MG PO TABS: 250.0000 mg | ORAL_TABLET | Freq: Two times a day (BID) | ORAL | 0 refills | Status: AC

## 2022-11-23 NOTE — Telephone Encounter (Signed)
Anastasiya spoke with the patient via telephone regarding upcoming surgery. I have sent in Cipro 250mg  BID x5 days to start preop and we counseled him to start these tonight. He expressed understanding.  Based on preop urine culture, we need to change the plan for intra-op antibiotics. Recommend gentamicin per pharmacy dosing.

## 2022-11-23 NOTE — Progress Notes (Signed)
Perioperative Services Pre-Admission/Anesthesia Testing    Date: 11/23/22  Name: Shane Patterson MRN:   [id number]  Re: EKG changes and need for preoperative clearance  Planned Surgical Procedure(s):    Case: [id number] Date/Time: 11/27/22 0730   Procedure: HOLEP-LASER ENUCLEATION OF THE PROSTATE WITH MORCELLATION   Anesthesia type: General   Pre-op diagnosis: Benign Prostatic Hyperplasia with Urinary Obstruction   Location: [facility] OR ROOM 10 / [facility]   Surgeons: [name], MD   Clinical Notes:  Patient is scheduled for the above procedure on 11/27/2022 with Dr. [name], MD. In preparation for his procedure, patient presented to the PAT clinic on the morning of 11/23/2022 for preoperative ECG. In review of the obtained tracing, patient with a presumably new LBBB. Patient has no previous ECG's for review in the [facility] system. I contact [facility] and [facility], neither of which have previous tracings available for this patient.   Contacted patient to discuss. Discussed LBBB and potential implications on is health; briefly reviewed pathophysiology. Patient denies known history of cardiovascular disease. Inquired as to whether or not patient has had a previous ECG in the past. He advises that he had a preoperative clearance visit with [facility] back in 2021. I have reached out to this facility in efforts to obtain copy of the EKG. Will plan on forwarding tracing to cardiology office for review/comparison when received. Patient states, "I had one there, they told me it was fine, and I had shoulder surgery".   Patient maintains a healthy lifestyle. He has HTN and pre-diabetes diagnoses. HTN is treated with ARB (irbesartan) monotherapy. No interventions in place for ASCVD prevention. Patient does report some intermittent episodes of fatigue/anergia that occurs from time to time. Overall, patient reports that he is active, however does not exercise as much as he has  in the past due to having to have surgery on his shoulder and the Covid pandemic. He does not smoke, use ETOH, or use illegal substances. Patient denies a significant family history of heart disease. Important to note in the setting of a potentially significant cardiovascular finding, patient is on a scheduled PDE5i (tadalafil) for BPH and also uses PRN sildenafil for ED.   EKG:      Impression and Plan:  Shane Patterson scheduled for elective urological procedure on 11/27/2022. Preoperative ECG showing a presumably new LBBB. Patient contacted to discuss abnormal ECG tracing. Reviewed need for further evaluation with cardiology prior to upcoming surgery. Patient did not believe finding on tracing, therefore he returned for a repeat 12 lead. Tracing was repeated showing NSR with no evidence of any IVCD. Reviewed tracings with on call cardiologist. Discussed that patient had concern regarding lead placement and chest hair. MD commented that lead placement and hair interference would not produce what he was seeing on the ECG. Cardiology Fletcher Anon, MD) felt like finding represented an intermittent LBBB, and noted that if patient is asymptomatic and has a reasonable functional capacity, no further workup is needed prior to surgery.   Discussed case with attending anesthesiologist Barbra Sarks, MD). MD made aware of initial ECG findings today. Discussed that repeat was normal. Dr. Barbra Sarks was updated on the details from my discussion/review of case with cardiology. Anesthesia ok with cardiology recommendations and advises that patient is appropriate to proceed with surgery as scheduled. Anesthesia recommending hat patient still be seen by cardiology for this in the future. Called patient to discuss multidisciplinary conversations. Patient advised that he was cleared to proceed with surgery on  Monday. Discussed need for cardiology consult. Patient was offered an appointment tomorrow (11/24/2022) with Dr. Kate Sable,  MD. Patient declined appointment citing that he wished to have time to research to ensure that practice was in network with his insurance. Additionally, he wanted time to read the available provider reviews in efforts to choose a provider who aligns with his personal care goals. Patient states, "I know that I am at the age where I see to see a cardiologist to talk about things. After I research a little, I will make seeing cardiology a priority". Cardiology office Zenia Resides, RN) updated.  Spoke with surgeon's office to update them on the aforementioned. No further needs from PAT identified at this time.   Honor Loh, MSN, APRN, FNP-C, CEN [facility]  Peri-operative Services Nurse Practitioner Phone: [phone] 11/23/22 2:21 PM  NOTE: This note has been prepared using Dragon dictation software. Despite my best ability to proofread, there is always the potential that unintentional transcriptional errors may still occur from this process.

## 2022-11-23 NOTE — Addendum Note (Signed)
Addended by: Gerald Leitz A on: 11/23/2022 03:33 PM   Modules accepted: Orders

## 2022-11-23 NOTE — Telephone Encounter (Signed)
Preop abx updated.

## 2022-11-24 ENCOUNTER — Encounter: Payer: Self-pay | Admitting: Urology

## 2022-11-26 MED ORDER — GENTAMICIN SULFATE 40 MG/ML IJ SOLN
2.5000 mg/kg | Freq: Once | INTRAVENOUS | Status: AC
  Administered 2022-11-27: 220 mg via INTRAVENOUS
  Administered 2022-11-27: 310 mg via INTRAVENOUS
  Filled 2022-11-26: qty 5.5

## 2022-11-26 MED ORDER — FAMOTIDINE 20 MG PO TABS: 20.0000 mg | ORAL_TABLET | Freq: Once | ORAL | Status: AC

## 2022-11-26 MED ORDER — ORAL CARE MOUTH RINSE: 15.0000 mL | Freq: Once | OROMUCOSAL | Status: AC

## 2022-11-26 MED ORDER — LACTATED RINGERS IV SOLN: INTRAVENOUS | Status: DC

## 2022-11-26 MED ORDER — CHLORHEXIDINE GLUCONATE 0.12 % MT SOLN: 15.0000 mL | Freq: Once | OROMUCOSAL | Status: AC

## 2022-11-27 ENCOUNTER — Other Ambulatory Visit: Payer: Self-pay

## 2022-11-27 ENCOUNTER — Encounter
Admission: RE | Disposition: A | Discharge status: Home / Self Care | Payer: Self-pay | Source: Home / Self Care | Attending: Urology

## 2022-11-27 ENCOUNTER — Ambulatory Visit: Payer: Medicare HMO | Admitting: Urgent Care

## 2022-11-27 ENCOUNTER — Ambulatory Visit
Admission: RE | Admit: 2022-11-27 | Discharge: 2022-11-27 | Disposition: A | Discharge status: Home / Self Care | Payer: Medicare HMO | Attending: Urology | Admitting: Urology

## 2022-11-27 ENCOUNTER — Encounter: Payer: Self-pay | Admitting: Urology

## 2022-11-27 DIAGNOSIS — N390 Urinary tract infection, site not specified: Secondary | ICD-10-CM | POA: Insufficient documentation

## 2022-11-27 DIAGNOSIS — N320 Bladder-neck obstruction: Secondary | ICD-10-CM | POA: Diagnosis not present

## 2022-11-27 DIAGNOSIS — R339 Retention of urine, unspecified: Secondary | ICD-10-CM

## 2022-11-27 DIAGNOSIS — C61 Malignant neoplasm of prostate: Secondary | ICD-10-CM

## 2022-11-27 DIAGNOSIS — R338 Other retention of urine: Secondary | ICD-10-CM | POA: Diagnosis not present

## 2022-11-27 DIAGNOSIS — N401 Enlarged prostate with lower urinary tract symptoms: Secondary | ICD-10-CM

## 2022-11-27 DIAGNOSIS — N138 Other obstructive and reflux uropathy: Secondary | ICD-10-CM

## 2022-11-27 HISTORY — PX: HOLEP-LASER ENUCLEATION OF THE PROSTATE WITH MORCELLATION: SHX6641

## 2022-11-27 SURGERY — ENUCLEATION, PROSTATE, USING LASER, WITH MORCELLATION
Anesthesia: General | Site: Prostate

## 2022-11-27 MED ORDER — SODIUM CHLORIDE 0.9 % IR SOLN
Status: DC | PRN
  Administered 2022-11-27 (×4): 12000 mL

## 2022-11-27 MED ORDER — HYDROCODONE-ACETAMINOPHEN 5-325 MG PO TABS: 1.0000 | ORAL_TABLET | Freq: Four times a day (QID) | ORAL | 0 refills | Status: DC | PRN

## 2022-11-27 MED ORDER — PROPOFOL 1000 MG/100ML IV EMUL
INTRAVENOUS | Status: AC
  Filled 2022-11-27: qty 100

## 2022-11-27 MED ORDER — FUROSEMIDE 10 MG/ML IJ SOLN
INTRAMUSCULAR | Status: DC | PRN
  Administered 2022-11-27: 10 mg via INTRAMUSCULAR

## 2022-11-27 MED ORDER — CHLORHEXIDINE GLUCONATE 0.12 % MT SOLN
OROMUCOSAL | Status: AC
  Administered 2022-11-27: 15 mL via OROMUCOSAL
  Filled 2022-11-27: qty 15

## 2022-11-27 MED ORDER — ONDANSETRON HCL 4 MG/2ML IJ SOLN
INTRAMUSCULAR | Status: DC | PRN
  Administered 2022-11-27: 4 mg via INTRAVENOUS

## 2022-11-27 MED ORDER — HYDROMORPHONE HCL 1 MG/ML IJ SOLN
INTRAMUSCULAR | Status: DC | PRN
  Administered 2022-11-27: .4 mg via INTRAVENOUS

## 2022-11-27 MED ORDER — EPHEDRINE SULFATE (PRESSORS) 50 MG/ML IJ SOLN
INTRAMUSCULAR | Status: DC | PRN
  Administered 2022-11-27: 2.5 mg via INTRAVENOUS
  Administered 2022-11-27: 5 mg via INTRAVENOUS
  Administered 2022-11-27 (×3): 2.5 mg via INTRAVENOUS

## 2022-11-27 MED ORDER — ONDANSETRON HCL 4 MG/2ML IJ SOLN: 4.0000 mg | Freq: Once | INTRAMUSCULAR | Status: DC | PRN

## 2022-11-27 MED ORDER — GENTAMICIN SULFATE 40 MG/ML IJ SOLN
3.5000 mg/kg | Freq: Once | INTRAVENOUS | Status: DC
  Filled 2022-11-27: qty 7.75

## 2022-11-27 MED ORDER — DEXAMETHASONE SODIUM PHOSPHATE 10 MG/ML IJ SOLN
INTRAMUSCULAR | Status: DC | PRN
  Administered 2022-11-27: 10 mg via INTRAVENOUS

## 2022-11-27 MED ORDER — FAMOTIDINE 20 MG PO TABS
ORAL_TABLET | ORAL | Status: AC
  Administered 2022-11-27: 20 mg via ORAL
  Filled 2022-11-27: qty 1

## 2022-11-27 MED ORDER — PROPOFOL 10 MG/ML IV BOLUS
INTRAVENOUS | Status: AC
  Filled 2022-11-27: qty 20

## 2022-11-27 MED ORDER — SUGAMMADEX SODIUM 200 MG/2ML IV SOLN
INTRAVENOUS | Status: DC | PRN
  Administered 2022-11-27: 200 mg via INTRAVENOUS

## 2022-11-27 MED ORDER — FENTANYL CITRATE (PF) 100 MCG/2ML IJ SOLN: 25.0000 ug | INTRAMUSCULAR | Status: DC | PRN

## 2022-11-27 MED ORDER — PHENYLEPHRINE HCL-NACL 20-0.9 MG/250ML-% IV SOLN
INTRAVENOUS | Status: AC
  Filled 2022-11-27: qty 250

## 2022-11-27 MED ORDER — HYDROMORPHONE HCL 1 MG/ML IJ SOLN
INTRAMUSCULAR | Status: AC
  Filled 2022-11-27: qty 1

## 2022-11-27 MED ORDER — OXYBUTYNIN CHLORIDE 5 MG PO TABS: 5.0000 mg | ORAL_TABLET | Freq: Three times a day (TID) | ORAL | 0 refills | Status: DC | PRN

## 2022-11-27 MED ORDER — PHENYLEPHRINE HCL (PRESSORS) 10 MG/ML IV SOLN
INTRAVENOUS | Status: DC | PRN
  Administered 2022-11-27 (×4): 80 ug via INTRAVENOUS

## 2022-11-27 MED ORDER — ACETAMINOPHEN 10 MG/ML IV SOLN
INTRAVENOUS | Status: AC
  Filled 2022-11-27: qty 100

## 2022-11-27 MED ORDER — PROPOFOL 10 MG/ML IV BOLUS
INTRAVENOUS | Status: DC | PRN
  Administered 2022-11-27: 150 mg via INTRAVENOUS
  Administered 2022-11-27: 20 mg via INTRAVENOUS

## 2022-11-27 MED ORDER — ROCURONIUM BROMIDE 100 MG/10ML IV SOLN
INTRAVENOUS | Status: DC | PRN
  Administered 2022-11-27 (×2): 20 mg via INTRAVENOUS
  Administered 2022-11-27: 10 mg via INTRAVENOUS
  Administered 2022-11-27: 50 mg via INTRAVENOUS

## 2022-11-27 MED ORDER — LIDOCAINE HCL (CARDIAC) PF 100 MG/5ML IV SOSY
PREFILLED_SYRINGE | INTRAVENOUS | Status: DC | PRN
  Administered 2022-11-27: 100 mg via INTRAVENOUS

## 2022-11-27 MED ORDER — ACETAMINOPHEN 10 MG/ML IV SOLN
INTRAVENOUS | Status: DC | PRN
  Administered 2022-11-27: 1000 mg via INTRAVENOUS

## 2022-11-27 MED ORDER — FENTANYL CITRATE (PF) 100 MCG/2ML IJ SOLN
INTRAMUSCULAR | Status: AC
  Filled 2022-11-27: qty 2

## 2022-11-27 MED ORDER — FENTANYL CITRATE (PF) 100 MCG/2ML IJ SOLN
INTRAMUSCULAR | Status: DC | PRN
  Administered 2022-11-27 (×2): 50 ug via INTRAVENOUS

## 2022-11-27 MED ORDER — LACTATED RINGERS IV SOLN: INTRAVENOUS | Status: DC | PRN

## 2022-11-27 SURGICAL SUPPLY — 38 items
ADAPTER IRRIG TUBE 2 SPIKE SOL (ADAPTER) ×2 IMPLANT
ADPR TBG 2 SPK PMP STRL ASCP (ADAPTER) ×2
BAG DRN LRG CPC RND TRDRP CNTR (MISCELLANEOUS) ×1
BAG DRN RND TRDRP ANRFLXCHMBR (UROLOGICAL SUPPLIES)
BAG URINE DRAIN 2000ML AR STRL (UROLOGICAL SUPPLIES) IMPLANT
BAG URO DRAIN 4000ML (MISCELLANEOUS) IMPLANT
CATH FOL 2WAY LX 20X30 (CATHETERS) IMPLANT
CATH FOL 2WAY LX 22X30 (CATHETERS) IMPLANT
CATH FOLEY 3WAY 30CC 22FR (CATHETERS) IMPLANT
CATH URETL OPEN END 4X70 (CATHETERS) ×1 IMPLANT
CONTAINER COLLECT MORCELLATR (MISCELLANEOUS) ×1 IMPLANT
DRAPE 3/4 80X56 (DRAPES) ×1 IMPLANT
DRAPE UTILITY 15X26 TOWEL STRL (DRAPES) IMPLANT
FIBER LASER MOSES 550 DFL (Laser) ×1 IMPLANT
FILTER OVERFLOW MORCELLATOR (FILTER) ×1 IMPLANT
GAUZE 4X4 16PLY ~~LOC~~+RFID DBL (SPONGE) ×2 IMPLANT
GLOVE BIO SURGEON STRL SZ 6.5 (GLOVE) ×2 IMPLANT
GOWN STRL REUS W/ TWL LRG LVL3 (GOWN DISPOSABLE) ×2 IMPLANT
GOWN STRL REUS W/TWL LRG LVL3 (GOWN DISPOSABLE) ×2
HOLDER FOLEY CATH W/STRAP (MISCELLANEOUS) ×1 IMPLANT
IV NS IRRIG 3000ML ARTHROMATIC (IV SOLUTION) ×4 IMPLANT
KIT TURNOVER CYSTO (KITS) ×1 IMPLANT
MBRN O SEALING YLW 17 FOR INST (MISCELLANEOUS) ×1
MEMBRANE SLNG YLW 17 FOR INST (MISCELLANEOUS) ×1 IMPLANT
MORCELLATOR COLLECT CONTAINER (MISCELLANEOUS) ×1
MORCELLATOR OVERFLOW FILTER (FILTER) ×1
MORCELLATOR ROTATION 4.75 335 (MISCELLANEOUS) ×1 IMPLANT
PACK CYSTO AR (MISCELLANEOUS) ×1 IMPLANT
SET CYSTO W/LG BORE CLAMP LF (SET/KITS/TRAYS/PACK) IMPLANT
SET IRRIG Y TYPE TUR BLADDER L (SET/KITS/TRAYS/PACK) ×1 IMPLANT
SLEEVE PROTECTION STRL DISP (MISCELLANEOUS) ×2 IMPLANT
SURGILUBE 2OZ TUBE FLIPTOP (MISCELLANEOUS) ×1 IMPLANT
SYR TOOMEY IRRIG 70ML (MISCELLANEOUS) ×1
SYRINGE TOOMEY IRRIG 70ML (MISCELLANEOUS) ×1 IMPLANT
TRAP FLUID SMOKE EVACUATOR (MISCELLANEOUS) ×1 IMPLANT
TUBE PUMP MORCELLATOR PIRANHA (TUBING) ×1 IMPLANT
WATER STERILE IRR 1000ML POUR (IV SOLUTION) ×1 IMPLANT
WATER STERILE IRR 500ML POUR (IV SOLUTION) ×1 IMPLANT

## 2022-11-27 NOTE — Transfer of Care (Signed)
Immediate Anesthesia Transfer of Care Note  Patient: Shane Patterson  Procedure(s) Performed: HOLEP-LASER ENUCLEATION OF THE PROSTATE WITH MORCELLATION (Prostate)  Patient Location: PACU  Anesthesia Type:General  Level of Consciousness: drowsy  Airway & Oxygen Therapy: Patient Spontanous Breathing and Patient connected to face mask oxygen  Post-op Assessment: Report given to RN and Post -op Vital signs reviewed and stable  Post vital signs: Reviewed  Last Vitals:  Vitals Value Taken Time  BP 136/84 11/27/22 1047  Temp 92F   Pulse 63 11/27/22 1050  Resp 10 11/27/22 1050  SpO2 100 % 11/27/22 1050  Vitals shown include unvalidated device data.  Last Pain:  Vitals:   11/27/22 0610  TempSrc: Tympanic  PainSc: 0-No pain         Complications: No notable events documented.

## 2022-11-27 NOTE — Anesthesia Procedure Notes (Signed)
Procedure Name: Intubation Date/Time: 11/27/2022 7:58 AM  Performed by: Otho Perl, CRNAPre-anesthesia Checklist: Patient identified, Patient being monitored, Timeout performed, Emergency Drugs available and Suction available Patient Re-evaluated:Patient Re-evaluated prior to induction Oxygen Delivery Method: Circle system utilized Preoxygenation: Pre-oxygenation with 100% oxygen Induction Type: IV induction Ventilation: Mask ventilation without difficulty Laryngoscope Size: Mac and 3 Grade View: Grade II Tube type: Oral Tube size: 7.0 mm Number of attempts: 1 Airway Equipment and Method: Stylet Placement Confirmation: ETT inserted through vocal cords under direct vision, positive ETCO2 and breath sounds checked- equal and bilateral Secured at: 21 cm Tube secured with: Tape Dental Injury: Teeth and Oropharynx as per pre-operative assessment and Injury to tongue  Comments: Small cut to tongue. Hemostasis achieved.

## 2022-11-27 NOTE — Discharge Instructions (Addendum)
Holmium Laser Enucleation of the Prostate (HoLEP)  HoLEP is a treatment for men with benign prostatic hyperplasia (BPH). The laser surgery removed blockages of urine flow, and is done without any incisions on the body.     What is HoLEP?  HoLEP is a type of laser surgery used to treat obstruction (blockage) of urine flow as a result of benign prostatic hyperplasia (BPH). In men with BPH, the prostate gland is not cancerous, but has become enlarged. An enlarged prostate can result in a number of urinary tract symptoms such as weak urinary stream, difficulty in starting urination, inability to urinate, frequent urination, or getting up at night to urinate.  HoLEP was developed in the 1990's as a more effective and less expensive surgical option for BPH, compared to other surgical options such as laser vaporization(PVP/greenlight laser), transurethral resection of the prostate(TURP), and open simple prostatectomy.   What happens during a HoLEP?  HoLEP requires general anesthesia ("asleep" throughout the procedure).   An antibiotic is given to reduce the risk of infection  A surgical instrument called a resectoscope is inserted through the urethra (the tube that carries urine from the bladder). The resectoscope has a camera that allows the surgeon to view the internal structure of the prostate gland, and to see where the incisions are being made during surgery.  The laser is inserted into the resectoscope and is used to enucleate (free up) the enlarged prostate tissue from the capsule (outer shell) and then to seal up any blood vessels. The tissue that has been removed is pushed back into the bladder.  A morcellator is placed through the resectoscope, and is used to suction out the prostate tissue that has been pushed into the bladder.  When the prostate tissue has been removed, the resectoscope is removed, and a foley catheter is placed to allow healing and drain the urine from the  bladder.     What happens after a HoLEP?  More than 90% of patients go home the same day a few hours after surgery. Less than 10% will be admitted to the hospital overnight for observation to monitor the urine, or if they have other medical problems.  Fluid is flushed through the catheter for about 1 hour after surgery to clear any blood from the urine. It is normal to have some blood in the urine after surgery. The need for blood transfusion is extremely rare.  Eating and drinking are permitted after the procedure once the patient has fully awakened from anesthesia.  The catheter is usually removed 2-3 days after surgery- the patient will come to clinic to have the catheter removed and make sure they can urinate on their own.  It is very important to drink lots of fluids after surgery for one week to keep the bladder flushed.  At first, there may be some burning with urination, but this typically improved within a few hours to days. Most patients do not have a significant amount of pain, and narcotic pain medications are rarely needed.  Symptoms of urinary frequency, urgency, and even leakage are NORMAL for the first few weeks after surgery as the bladder adjusts after having to work hard against blockage from the prostate for many years. This will improve, but can sometimes take several months.  The use of pelvic floor exercises (Kegel exercises) can help improve problems with urinary incontinence.   After catheter removal, patients will be seen at 6 weeks and 6 months for symptom check  No heavy lifting for   at least 2-3 weeks after surgery, however patients can walk and do light activities the first day after surgery. Return to work time depends on occupation.    What are the advantages of HoLEP?  HoLEP has been studied in many different parts of the world and has been shown to be a safe and effective procedure. Although there are many types of BPH surgeries available, HoLEP offers a  unique advantage in being able to remove a large amount of tissue without any incisions on the body, even in very large prostates, while decreasing the risk of bleeding and providing tissue for pathology (to look for cancer). This decreases the need for blood transfusions during surgery, minimizes hospital stay, and reduces the risk of needing repeat treatment.  What are the side effects of HoLEP?  Temporary burning and bleeding during urination. Some blood may be seen in the urine for weeks after surgery and is part of the healing process.  Urinary incontinence (inability to control urine flow) is expected in all patients immediately after surgery and they should wear pads for the first few days/weeks. This typically improves over the course of several weeks. Performing Kegel exercises can help decrease leakage from stress maneuvers such as coughing, sneezing, or lifting. The rate of long term leakage is very low. Patients may also have leakage with urgency and this may be treated with medication. The risk of urge incontinence can be dependent on several factors including age, prostate size, symptoms, and other medical problems.  Retrograde ejaculation or "backwards ejaculation." In 75% of cases, the patient will not see any fluid during ejaculation after surgery.  Erectile function is generally not significantly affected.   What are the risks of HoLEP?  Injury to the urethra or development of scar tissue at a later date  Injury to the capsule of the prostate (typically treated with longer catheterization).  Injury to the bladder or ureteral orifices (where the urine from the kidney drains out)  Infection of the bladder, testes, or kidneys  Return of urinary obstruction at a later date requiring another operation (<2%)  Need for blood transfusion or re-operation due to bleeding  Failure to relieve all symptoms and/or need for prolonged catheterization after surgery  5-15% of patients are  found to have previously undiagnosed prostate cancer in their specimen. Prostate cancer can be treated after HoLEP.  Standard risks of anesthesia including blood clots, heart attacks, etc  When should I call my doctor?  Fever over 101.3 degrees  Inability to urinate, or large blood clots in the urine  AMBULATORY SURGERY  DISCHARGE INSTRUCTIONS   The drugs that you were given will stay in your system until tomorrow so for the next 24 hours you should not:  Drive an automobile Make any legal decisions Drink any alcoholic beverage   You may resume regular meals tomorrow.  Today it is better to start with liquids and gradually work up to solid foods.  You may eat anything you prefer, but it is better to start with liquids, then soup and crackers, and gradually work up to solid foods.   Please notify your doctor immediately if you have any unusual bleeding, trouble breathing, redness and pain at the surgery site, drainage, fever, or pain not relieved by medication.    Additional Instructions:        Please contact your physician with any problems or Same Day Surgery at [phone], Monday through Friday 6 am to 4 pm, or [facility] [facility] Main number   at [phone].  

## 2022-11-27 NOTE — Anesthesia Preprocedure Evaluation (Signed)
Anesthesia Evaluation  Patient identified by MRN, date of birth, ID band Patient awake    Reviewed: Allergy & Precautions, H&P , NPO status , Patient's Chart, lab work & pertinent test results, reviewed documented beta blocker date and time   History of Anesthesia Complications Negative for: history of anesthetic complications  Airway Mallampati: I  TM Distance: >3 FB Neck ROM: full    Dental  (+) Dental Advidsory Given, Caps, Missing, Teeth Intact   Pulmonary neg pulmonary ROS   Pulmonary exam normal breath sounds clear to auscultation       Cardiovascular Exercise Tolerance: Good hypertension, (-) angina (-) Past MI and (-) Cardiac Stents Normal cardiovascular exam(-) Valvular Problems/Murmurs Rhythm:regular Rate:Normal     Neuro/Psych negative neurological ROS  negative psych ROS   GI/Hepatic negative GI ROS, Neg liver ROS,,,  Endo/Other  negative endocrine ROS    Renal/GU negative Renal ROS  negative genitourinary   Musculoskeletal   Abdominal   Peds  Hematology negative hematology ROS (+)   Anesthesia Other Findings Past Medical History: No date: Allergy     Comment:  CATS and PLANTS No date: BPH (benign prostatic hyperplasia)     Comment:  a.) on scheduled PDE5i (tadalafil) No date: Complication of anesthesia     Comment:  a.) delayed emergence No date: ED (erectile dysfunction)     Comment:  a.) on PDE5i (sildenafil) PRN No date: Elevated PSA No date: Hypertension 11/23/2022: LBBB (left bundle branch block)     Comment:  a.) noted on preoperative ECG; no priors for comparison;              repeat same day tracings normal consistent with               intermittent LBBB. No date: Pre-diabetes No date: Spermatocele   Reproductive/Obstetrics negative OB ROS                             Anesthesia Physical Anesthesia Plan  ASA: 2  Anesthesia Plan: General   Post-op Pain  Management:    Induction: Intravenous  PONV Risk Score and Plan: 2 and Ondansetron, Dexamethasone, Midazolam and Treatment may vary due to age or medical condition  Airway Management Planned: LMA and Oral ETT  Additional Equipment:   Intra-op Plan:   Post-operative Plan: Extubation in OR  Informed Consent: I have reviewed the patients History and Physical, chart, labs and discussed the procedure including the risks, benefits and alternatives for the proposed anesthesia with the patient or authorized representative who has indicated his/her understanding and acceptance.     Dental Advisory Given  Plan Discussed with: Anesthesiologist, CRNA and Surgeon  Anesthesia Plan Comments:        Anesthesia Quick Evaluation

## 2022-11-27 NOTE — Interval H&P Note (Signed)
History and Physical Interval Note:  11/27/2022 7:32 AM  Shane Patterson  has presented today for surgery, with the diagnosis of Benign Prostatic Hyperplasia with Urinary Obstruction.  The various methods of treatment have been discussed with the patient and family. After consideration of risks, benefits and other options for treatment, the patient has consented to  Procedure(s): [place] WITH MORCELLATION (N/A) as a surgical intervention.  The patient's history has been reviewed, patient examined, no change in status, stable for surgery.  I have reviewed the patient's chart and labs.  Questions were answered to the patient's satisfaction.    RRR CTAB   [name]

## 2022-11-27 NOTE — Op Note (Signed)
Date of procedure: 11/27/22  Preoperative diagnosis:  BPH with BOO Urinary retention  Postoperative diagnosis:  same   Procedure: HoLEP with morcellation  Surgeon: [name], MD  Anesthesia: General  Complications: None  Intraoperative findings:   EBL: 100 cc  Specimens: prostate chips  Drains: 20 Fr 3 way foley with 50 cc balloon  Indication: Shane Patterson is a 67 y.o. patient with massive BPH with refractory urinary retention.  After reviewing the management options for treatment, he elected to proceed with the above surgical procedure(s). We have discussed the potential benefits and risks of the procedure, side effects of the proposed treatment, the likelihood of the patient achieving the goals of the procedure, and any potential problems that might occur during the procedure or recuperation. Informed consent has been obtained.  Description of procedure:  The patient was taken to the operating room and general anesthesia was induced.  The patient was placed in the dorsal lithotomy position, prepped and draped in the usual sterile fashion, and preoperative antibiotics were administered. A preoperative time-out was performed.     A 26 French resectoscope sheath using a blunt angled obturator was introduced without difficulty into the bladder.  The bladder was carefully inspected and noted to be mildly trabeculated.  There is an elevated bladder neck with a fused left lateral and median lobe.  The trigone was able to be visualized with some manipulation and the UOs were a good distance bladder neck itself.  The prostatic fossa had significant trilobar coaptation with greater than 5 cm prostatic length.  A 550 m laser fiber was then brought in and using settings of 2 J's and 50 Hz, 2 incisions were created at the 7:00 position of the bladder neck on either side of the median lobe down to the level of the bladder neck/capsular fibers.  The incision was carried down caudally meeting  in the midline just above the verumontanum.    Next, a semilunar incision was created at the prostatic apex on the left side again freeing up the adenoma from the underlying capsule.  Care was taken to avoid any resection past the verumontanum.  This incision was carried around laterally and cranially towards the bladder neck.  He ended up and including the median lobe as well as the left lateral lobe together. Ultimately, I was able to complete the anterior commissure mucosa and the adenoma into the bladder creating a widely patent prostatic fossa.     Next, the same similar incision was created at the right prostatic apex.  This adenoma however ended up being enucleated and more of a piece wise fashion freeing up a large BPH nodules from the capsular fibers.  Once this was completed and cleared from the bladder neck, the prostatic fossa was noted to be widely patent.  Hemostasis was achieved using hemostatic fiber settings.  Bilateral UOs were visualized and free of any injury.  Finally, the 58 French resectoscope was exchanged for nephroscope and using the Piranha handpiece morcellator, the bladder was distended in each of the prostate chips were evacuated.  The bladder was irrigated several times and smaller chips were cleared from the bladder.  This point time, there were no residual fibers appreciated in the bladder.  Hemostasis was adequate.  10 mg of IV Lasix was administered to help with postoperative diuresis.  A 20 French two-way Foley catheter was then inserted over a catheter guide with 50 cc in the balloon.  The catheter irrigated easily and well.  Patient was  then clean and dry, repositioned supine position, reversed from anesthesia, taken to PACU in stable condition.   Plan: Patient will return to the office in 7 days for voiding trial.      [name], M.D.

## 2022-11-28 NOTE — Anesthesia Postprocedure Evaluation (Signed)
Anesthesia Post Note  Patient: Shane Patterson  Procedure(s) Performed: HOLEP-LASER ENUCLEATION OF THE PROSTATE WITH MORCELLATION (Prostate)  Patient location during evaluation: PACU Anesthesia Type: General Level of consciousness: awake and alert Pain management: pain level controlled Vital Signs Assessment: post-procedure vital signs reviewed and stable Respiratory status: spontaneous breathing, nonlabored ventilation, respiratory function stable and patient connected to nasal cannula oxygen Cardiovascular status: blood pressure returned to baseline and stable Postop Assessment: no apparent nausea or vomiting Anesthetic complications: no   No notable events documented.   Last Vitals:  Vitals:   11/27/22 1115 11/27/22 1140  BP: 138/88 (!) 154/92  Pulse: 70 82  Resp: 16 20  Temp: (!) 36.4 C 36.7 C  SpO2: 99% 96%    Last Pain:  Vitals:   11/28/22 1000  TempSrc:   PainSc: 0-No pain                 Martha Clan

## 2022-11-29 LAB — SURGICAL PATHOLOGY

## 2022-12-04 ENCOUNTER — Ambulatory Visit (INDEPENDENT_AMBULATORY_CARE_PROVIDER_SITE_OTHER): Payer: Medicare HMO | Admitting: Physician Assistant

## 2022-12-04 ENCOUNTER — Encounter: Payer: Medicare HMO | Admitting: Physician Assistant

## 2022-12-04 VITALS — BP 159/84 | HR 76 | Ht 73.0 in | Wt 195.0 lb

## 2022-12-04 DIAGNOSIS — N138 Other obstructive and reflux uropathy: Secondary | ICD-10-CM | POA: Diagnosis not present

## 2022-12-04 DIAGNOSIS — N401 Enlarged prostate with lower urinary tract symptoms: Secondary | ICD-10-CM | POA: Diagnosis not present

## 2022-12-04 LAB — BLADDER SCAN AMB NON-IMAGING: Scan Result: 135 mL

## 2022-12-04 NOTE — Progress Notes (Signed)
Catheter Removal  Patient is present today for a catheter removal.  50 ml of water was drained from the balloon. A 20 FR foley cath was removed from the bladder, no complications were noted. Patient tolerated well.  Performed by: Randa Lynn, RMA  Follow up/ Additional notes: This afternoon

## 2022-12-04 NOTE — Progress Notes (Signed)
Afternoon follow-up  Patient returned to clinic this afternoon for repeat PVR. He has been able to urinate and reports significant urinary leakage. PVR .  Results for orders placed or performed in visit on 12/04/22  BLADDER SCAN AMB NON-IMAGING  Result Value Ref Range   Scan Result 135 mL    Voiding trial passed. He is very concerned about urinary leakage management.  Counseled patient on normal postoperative findings including dysuria, gross hematuria, and urinary urgency/leakage. Counseled patient to begin Kegel exercises 3x10 sets daily to increase urinary control and wear absorbent products as needed for security. I also instructed him on condom catheter fitting and send him home with samples today; he will call us with his preferred size so we may place an order for him. Written and verbal resources provided today. Surgical pathology with low volume Gleason 3+3 prostate cancer despite prior negative biopsies. We discussed the need for follow-up PSA monitoring. He expressed understanding.   Follow up: Return in about 3 months (around 03/05/2023) for Postop f/u with PSA prior.

## 2022-12-04 NOTE — Telephone Encounter (Signed)
Error

## 2022-12-04 NOTE — Patient Instructions (Signed)

## 2022-12-05 ENCOUNTER — Telehealth: Payer: Self-pay | Admitting: Physician Assistant

## 2022-12-05 NOTE — Telephone Encounter (Signed)
Patient called and stated that he had an appt with Carman Ching yesterday (12/04/22), and that she asked him to call back with size for medical device. Size is 35 mm. He said the [place] # [place] [id number]. He requested a call back at [phone].

## 2022-12-07 NOTE — Telephone Encounter (Signed)
Pt LMOM he wants to cancel his condom catheter order.  He is doing fine now and doesn't need them.

## 2023-01-09 ENCOUNTER — Telehealth: Payer: Self-pay | Admitting: Urology

## 2023-01-09 ENCOUNTER — Ambulatory Visit: Payer: PPO | Admitting: Urology

## 2023-01-09 NOTE — Telephone Encounter (Signed)
Patient is scheduled for lab on 02/27/23 and he called to ask if he could have a PSE instead of PSA. He said that PSE is more accurate. He also said that his urine and been darker and cloudier recently. He asked if Dr. Apolinar Junes would want to check his urine as well. Please advise patient.

## 2023-01-09 NOTE — Telephone Encounter (Signed)
AUA guidelines along with other screening taskforce still recommend PSA.  PSE is investigational and has not been validated or widely used.  I also imagine there is insurance issues.  Yes we can check your urine at your follow-up.  Vanna Scotland, MD

## 2023-01-09 NOTE — Telephone Encounter (Signed)
Patient advised.

## 2023-02-26 ENCOUNTER — Other Ambulatory Visit: Payer: Self-pay

## 2023-02-26 DIAGNOSIS — R972 Elevated prostate specific antigen [PSA]: Secondary | ICD-10-CM

## 2023-02-27 ENCOUNTER — Other Ambulatory Visit: Payer: Medicare HMO

## 2023-02-27 DIAGNOSIS — R972 Elevated prostate specific antigen [PSA]: Secondary | ICD-10-CM

## 2023-02-28 LAB — PSA: Prostate Specific Ag, Serum: 0.9 ng/mL (ref 0.0–4.0)

## 2023-03-06 ENCOUNTER — Telehealth: Payer: Medicare HMO | Admitting: Urology

## 2023-04-01 DIAGNOSIS — M19019 Primary osteoarthritis, unspecified shoulder: Secondary | ICD-10-CM | POA: Insufficient documentation

## 2023-05-01 ENCOUNTER — Encounter: Payer: Self-pay | Admitting: Urology

## 2023-05-01 ENCOUNTER — Ambulatory Visit: Payer: Medicare HMO | Admitting: Urology

## 2023-05-01 VITALS — BP 155/83 | HR 90

## 2023-05-01 DIAGNOSIS — N138 Other obstructive and reflux uropathy: Secondary | ICD-10-CM

## 2023-05-01 DIAGNOSIS — N401 Enlarged prostate with lower urinary tract symptoms: Secondary | ICD-10-CM | POA: Diagnosis not present

## 2023-05-01 DIAGNOSIS — C61 Malignant neoplasm of prostate: Secondary | ICD-10-CM | POA: Diagnosis not present

## 2023-05-01 DIAGNOSIS — N528 Other male erectile dysfunction: Secondary | ICD-10-CM

## 2023-05-01 LAB — BLADDER SCAN AMB NON-IMAGING: Scan Result: 18

## 2023-05-01 NOTE — Progress Notes (Signed)
Marcelle Overlie Plume,acting as a scribe for Vanna Scotland, MD.,have documented all relevant documentation on the behalf of Vanna Scotland, MD,as directed by  Vanna Scotland, MD while in the presence of Vanna Scotland, MD.  05/01/2023 9:18 AM   Shane Patterson 23-Jan-1956 [id number]  Referring provider: Clemencia Course, PA-C [id number] [place],  [place] [address]  Chief Complaint  Patient presents with   Post-op Follow-up    HPI: 67 year-old male with a personal history of BPH.   He is status post HoLEP on 11/27/2022 for refractory urinary retention. He ultimately passed a voiding trial. Notably, he does have a personal history of BPH as well as elevated PSA. He was followed by Dr. Isabel Caprice and then Dr. Pete Glatter. He did have a PI-RADS 3 lesion and underwent a fusion biopsy in 2023 that was negative for malignancy but was noted to have a 195 gram prostate. His pre-procedure PSA was 16.48.   Surgical pathology does show some incidental Gleason 3+3 prostate cancer involving 1% of the tissue examined.   He follows up today with a post procedure PSA of 0.9 on 02/27/2023.    Today, it appears that he is doing really well in terms of urinary symptoms. He only gets up once a day to urinate and is mostly satisfied. He also reports increased urinary flow but experiences some post-void dribbling. He mentions difficulty in achieving and maintaining erections, which he attributes to aging and possibly inflammation or irritation. He is currently using Cialis daily to his manage urinary symptoms as well as erectile dysfunction.  Results for orders placed or performed in visit on 05/01/23  Bladder Scan (Post Void Residual) in office  Result Value Ref Range   Scan Result 18 ml     IPSS     Row Name 05/01/23 0800         International Prostate Symptom Score   How often have you had the sensation of not emptying your bladder? Not at All     How often have you had to urinate less than  every two hours? Not at All     How often have you found you stopped and started again several times when you urinated? Not at All     How often have you found it difficult to postpone urination? Not at All     How often have you had a weak urinary stream? Not at All     How often have you had to strain to start urination? Not at All     How many times did you typically get up at night to urinate? 1 Time     Total IPSS Score 1       Quality of Life due to urinary symptoms   If you were to spend the rest of your life with your urinary condition just the way it is now how would you feel about that? Mostly Satisfied              Score:  1-7 Mild 8-19 Moderate 20-35 Severe    PMH: Past Medical History:  Diagnosis Date   Allergy    CATS and PLANTS   BPH (benign prostatic hyperplasia)    a.) on scheduled PDE5i (tadalafil)   Complication of anesthesia    a.) delayed emergence   ED (erectile dysfunction)    a.) on PDE5i (sildenafil) PRN   Elevated PSA    Hypertension    LBBB (left bundle branch block) 11/23/2022  a.) noted on preoperative ECG; no priors for comparison; repeat same day tracings normal consistent with intermittent LBBB.   Pre-diabetes    Spermatocele     Surgical History: Past Surgical History:  Procedure Laterality Date   COLONOSCOPY     HOLEP-LASER ENUCLEATION OF THE PROSTATE WITH MORCELLATION N/A 11/27/2022   Procedure: HOLEP-LASER ENUCLEATION OF THE PROSTATE WITH MORCELLATION;  Surgeon: Vanna Scotland, MD;  Location: [facility] ORS;  Service: Urology;  Laterality: N/A;   SHOULDER SURGERY Right 10/2021    Home Medications:  Allergies as of 05/01/2023       Reactions   Flomax [tamsulosin] Itching, Other (See Comments)   Nasal passages got blocked    Atorvastatin Itching   Lisinopril Cough        Medication List        Accurate as of May 01, 2023  9:18 AM. If you have any questions, ask your nurse or doctor.          STOP taking these  medications    HYDROcodone-acetaminophen 5-325 MG tablet Commonly known as: NORCO/VICODIN Stopped by: Vanna Scotland   oxybutynin 5 MG tablet Commonly known as: DITROPAN Stopped by: Vanna Scotland       TAKE these medications    fluticasone 50 MCG/ACT nasal spray Commonly known as: FLONASE Place into both nostrils daily.   irbesartan 150 MG tablet Commonly known as: AVAPRO Take 150 mg by mouth daily.   OVER THE COUNTER MEDICATION Olive oil, 1 tbs every night   sildenafil 100 MG tablet Commonly known as: VIAGRA Take 1 tablet (100 mg total) by mouth daily as needed for erectile dysfunction.   tadalafil 5 MG tablet Commonly known as: CIALIS Take 5 mg by mouth daily. For BPH        Allergies:  Allergies  Allergen Reactions   Flomax [Tamsulosin] Itching and Other (See Comments)    Nasal passages got blocked    Atorvastatin Itching   Lisinopril Cough    Family History: Family History  Problem Relation Age of Onset   Diabetes Father    Diabetes Paternal Grandmother     Social History:  reports that he has never smoked. He has never used smokeless tobacco. He reports current alcohol use. He reports that he does not use drugs.   Physical Exam: BP (!) 155/83   Pulse 90   Constitutional:  Alert and oriented, No acute distress. HEENT: [place] AT, moist mucus membranes.  Trachea midline, no masses. Neurologic: Grossly intact, no focal deficits, moving all 4 extremities. Psychiatric: Normal mood and affect.   Assessment & Plan:    1. BPH with urinary obstruction - Status post HoLEP with significant improvement in urinary symptoms. - Continue monitoring -d/c flomax  2. Prostate cancer - Incidental Gleason 3+3 prostate cancer involving 1% of tissue. -Recommend active surveillance for incidental low risk prostate cancer - Follow PSA annually.  3. Erectile dysfunction - He is changing his PCP and asked if we could take over his erectile dysfunction  management - He has been using Cialis 5 mg daily both for urinary issues as well as erectile dysfunction then using sildenafil PRN. - We will continue these medications.   Return in about 1 year (around 04/30/2024) for repeat PSA.  I have reviewed the above documentation for accuracy and completeness, and I agree with the above.   Vanna Scotland, MD   [facility] [address] [place], [place] [address] [phone]

## 2023-05-28 DIAGNOSIS — Z9889 Other specified postprocedural states: Secondary | ICD-10-CM | POA: Insufficient documentation

## 2023-09-13 DIAGNOSIS — D3612 Benign neoplasm of peripheral nerves and autonomic nervous system, upper limb, including shoulder: Secondary | ICD-10-CM | POA: Diagnosis not present

## 2023-09-13 DIAGNOSIS — D225 Melanocytic nevi of trunk: Secondary | ICD-10-CM | POA: Diagnosis not present

## 2023-09-13 DIAGNOSIS — L570 Actinic keratosis: Secondary | ICD-10-CM | POA: Diagnosis not present

## 2023-09-13 DIAGNOSIS — D2262 Melanocytic nevi of left upper limb, including shoulder: Secondary | ICD-10-CM | POA: Diagnosis not present

## 2023-09-13 DIAGNOSIS — Z85828 Personal history of other malignant neoplasm of skin: Secondary | ICD-10-CM | POA: Diagnosis not present

## 2023-10-11 ENCOUNTER — Ambulatory Visit: Payer: Medicare Other | Admitting: Physician Assistant

## 2023-10-11 ENCOUNTER — Encounter: Payer: Self-pay | Admitting: Physician Assistant

## 2023-10-11 VITALS — BP 124/76 | HR 94 | Temp 98.2°F | Ht 73.0 in | Wt 195.0 lb

## 2023-10-11 DIAGNOSIS — C61 Malignant neoplasm of prostate: Secondary | ICD-10-CM | POA: Diagnosis not present

## 2023-10-11 DIAGNOSIS — I10 Essential (primary) hypertension: Secondary | ICD-10-CM

## 2023-10-11 DIAGNOSIS — E782 Mixed hyperlipidemia: Secondary | ICD-10-CM

## 2023-10-11 DIAGNOSIS — G479 Sleep disorder, unspecified: Secondary | ICD-10-CM

## 2023-10-11 DIAGNOSIS — E785 Hyperlipidemia, unspecified: Secondary | ICD-10-CM | POA: Insufficient documentation

## 2023-10-11 DIAGNOSIS — N529 Male erectile dysfunction, unspecified: Secondary | ICD-10-CM

## 2023-10-11 MED ORDER — IRBESARTAN 150 MG PO TABS
150.0000 mg | ORAL_TABLET | Freq: Every day | ORAL | 3 refills | Status: DC
Start: 2023-10-11 — End: 2023-10-11

## 2023-10-11 MED ORDER — IRBESARTAN 150 MG PO TABS
75.0000 mg | ORAL_TABLET | Freq: Every day | ORAL | Status: AC
Start: 2023-10-11 — End: ?

## 2023-10-11 MED ORDER — TADALAFIL 5 MG PO TABS
5.0000 mg | ORAL_TABLET | Freq: Every day | ORAL | 3 refills | Status: AC
Start: 2023-10-11 — End: ?

## 2023-10-11 NOTE — Patient Instructions (Addendum)
-  It was a pleasure to see you today! Please review your visit summary for helpful information -I would encourage you to follow your care via MyChart where you can access lab results, notes, messages, and more -If you feel that we did a nice job today, please complete your after-visit survey and leave [place] a Google review! Your CMA today was Mariann Barter and your provider was Alvester Morin, PA-C, DMSc -Please return for follow-up in about 1 month  Reviewed common practices to improve sleep hygiene including consistent sleep schedule, cool sleeping temperatures, keeping bedroom as dark as possible, daytime physical activity (especially resistance training), avoiding harmful bedtime habits (e.g. TV, phone use, etc),  avoiding oral intake within 1 hour of bed, avoiding evening caffeine/alcohol consumption.  Consider mindfulness exercises for sleep e.g. Calm app, yoga, etc.

## 2023-10-11 NOTE — Progress Notes (Signed)
Date:  10/11/2023   Name:  Shane Patterson   DOB:  10-22-1955   MRN:  [id number]   Chief Complaint: Establish Care, Cough, and Insomnia  Insomnia Primary symptoms: fragmented sleep, difficulty falling asleep, frequent awakening.   Episode onset: X7-8 months. The problem occurs nightly. The problem is unchanged. How many beverages per day that contain caffeine: 0 - 1.  Types of beverages you drink: coffee and tea (green tea). Past treatments include nothing. Typical bedtime:  10-11 P.M..  How long after going to bed to you fall asleep: less than 15 minutes.    Cough This is a recurrent problem. Episode onset: X 15 years. The problem has been unchanged. Episode frequency: comes and goes. The cough is Non-productive. Associated symptoms comments: Red eyes, itching . Treatments tried: lotion for itching. The treatment provided no relief.   Shane Patterson is a very pleasant 68 year old male with a history of HTN and BPH s/p HoLEP (with incidental prostate cancer 3+3 in 1% of tissue) who presents new to the practice today to establish care.  Primarily wishes to discuss sleep issues.  States he does not have much trouble with sleep onset, but struggles with sleep maintenance waking several times throughout the night.  Typically by 5 AM, he is ready to start the day.  He is not satisfied with his sleep quality, but wants to try to avoid medications if at all possible.  Previously took Unisom, but was advised not to use this every night so he stopped.  Mentions a cough, not discussed at length, probably allergic per patient report.    Medication list has been reviewed and updated.  Current Meds  Medication Sig   fluticasone (FLONASE) 50 MCG/ACT nasal spray Place into both nostrils daily.   sildenafil (VIAGRA) 100 MG tablet Take 1 tablet (100 mg total) by mouth daily as needed for erectile dysfunction.   [DISCONTINUED] irbesartan (AVAPRO) 150 MG tablet Take 150 mg by mouth daily.   [DISCONTINUED] OVER THE  COUNTER MEDICATION Olive oil, 1 tbs every night   [DISCONTINUED] tadalafil (CIALIS) 5 MG tablet Take 5 mg by mouth daily. For BPH     Review of Systems  Respiratory:  Positive for cough.   Psychiatric/Behavioral:  The patient has insomnia.     Patient Active Problem List   Diagnosis Date Noted   History of arthroscopic procedure on shoulder 05/28/2023   Localized, primary osteoarthritis of shoulder region 04/01/2023   BPH with obstruction/lower urinary tract symptoms 10/06/2021   Pain of lumbar spine 02/03/2020   Elevated PSA 03/18/2014   Spermatocele 03/18/2014   Male erectile dysfunction, unspecified 07/18/2013    Allergies  Allergen Reactions   Flomax [Tamsulosin] Itching and Other (See Comments)    Nasal passages got blocked    Atorvastatin Itching   Cat Dander Cough and Itching   Lisinopril Cough    Immunization History  Administered Date(s) Administered   Influenza, High Dose Seasonal PF 08/24/2020   Influenza,inj,Quad PF,6+ Mos 07/27/2014, 07/28/2015, 08/02/2016, 07/29/2019   Influenza-Unspecified 07/27/2014, 07/28/2015, 08/02/2016   Pfizer Covid-19 Vaccine Bivalent Booster 30yrs & up 09/01/2021    Past Surgical History:  Procedure Laterality Date   COLONOSCOPY     HOLEP-LASER ENUCLEATION OF THE PROSTATE WITH MORCELLATION N/A 11/27/2022   Procedure: HOLEP-LASER ENUCLEATION OF THE PROSTATE WITH MORCELLATION;  Surgeon: Vanna Scotland, MD;  Location: [facility] ORS;  Service: Urology;  Laterality: N/A;   SHOULDER SURGERY Right 10/2021    Social History   Tobacco Use  Smoking status: Never   Smokeless tobacco: Never  Vaping Use   Vaping status: Never Used  Substance Use Topics   Alcohol use: Yes    Comment: BEER orWINE occassionally   Drug use: No    Family History  Problem Relation Age of Onset   Stroke Mother    Diabetes Father    Diabetes Paternal Grandmother         10/11/2023    3:04 PM  GAD 7 : Generalized Anxiety Score  Nervous, Anxious, on  Edge 0  Control/stop worrying 0  Worry too much - different things 0  Trouble relaxing 0  Restless 0  Easily annoyed or irritable 0  Afraid - awful might happen 0  Total GAD 7 Score 0  Anxiety Difficulty Not difficult at all       10/11/2023    3:04 PM 07/28/2015    9:39 AM 07/27/2014    3:20 PM  Depression screen PHQ 2/9  Decreased Interest 0 0 0  Down, Depressed, Hopeless 0 0 0  PHQ - 2 Score 0 0 0    BP Readings from Last 3 Encounters:  10/11/23 124/76  05/01/23 (!) 155/83  12/04/22 (!) 159/84    Wt Readings from Last 3 Encounters:  10/11/23 195 lb (88.5 kg)  12/04/22 195 lb (88.5 kg)  11/27/22 195 lb (88.5 kg)    BP 124/76 (BP Location: Left Arm, Patient Position: Sitting, Cuff Size: Large)   Pulse 94   Temp 98.2 F (36.8 C)   Ht 6\' 1"  (1.854 m)   Wt 195 lb (88.5 kg)   SpO2 95%   BMI 25.73 kg/m   Physical Exam Vitals and nursing note reviewed.  Constitutional:      Appearance: Normal appearance.  Neck:     Vascular: No carotid bruit.  Cardiovascular:     Rate and Rhythm: Normal rate and regular rhythm.     Heart sounds: No murmur heard.    No friction rub. No gallop.  Pulmonary:     Effort: Pulmonary effort is normal.     Breath sounds: Normal breath sounds.  Abdominal:     General: There is no distension.  Musculoskeletal:        General: Normal range of motion.  Skin:    General: Skin is warm and dry.  Neurological:     Mental Status: He is alert and oriented to person, place, and time.     Gait: Gait is intact.  Psychiatric:        Mood and Affect: Mood and affect normal.     Recent Labs     Component Value Date/Time   NA 139 10/27/2020 0653   NA 143 08/02/2016 1245   K 4.1 10/27/2020 0653   CL 103 10/27/2020 0653   CO2 23 10/27/2020 0653   GLUCOSE 107 (H) 10/27/2020 0653   BUN 17 10/27/2020 0653   BUN 11 08/02/2016 1245   CREATININE 0.85 10/27/2020 0653   CREATININE 0.79 07/28/2015 1039   CALCIUM 9.8 10/27/2020 0653   PROT  7.1 08/02/2016 1245   ALBUMIN 4.7 08/02/2016 1245   AST 34 08/02/2016 1245   ALT 36 08/02/2016 1245   ALKPHOS 114 08/02/2016 1245   BILITOT 0.4 08/02/2016 1245   GFRNONAA >60 10/27/2020 0653   GFRNONAA >89 07/27/2014 1615   GFRAA 108 08/02/2016 1245   GFRAA >89 07/27/2014 1615    Lab Results  Component Value Date   WBC 7.0 10/27/2020   HGB 15.7  10/27/2020   HCT 44.8 10/27/2020   MCV 92.2 10/27/2020   PLT 262 10/27/2020   Lab Results  Component Value Date   HGBA1C 5.6 08/02/2016   Lab Results  Component Value Date   CHOL 197 08/02/2016   HDL 35 (L) 08/02/2016   LDLCALC 134 (H) 08/02/2016   TRIG 140 08/02/2016   CHOLHDL 5.6 (H) 08/02/2016   Lab Results  Component Value Date   TSH 1.390 08/02/2016     Assessment and Plan:  1. Primary hypertension (Primary) Refill irbesartan and tadalafil.  Patient actually takes about a third of his irbesartan tablet daily.  - tadalafil (CIALIS) 5 MG tablet; Take 1 tablet (5 mg total) by mouth daily.  Dispense: 90 tablet; Refill: 3 - irbesartan (AVAPRO) 150 MG tablet; Take 0.5 tablets (75 mg total) by mouth daily.  2. Sleeping difficulty Reviewed common practices to improve sleep hygiene including consistent sleep schedule, cool sleeping temperatures, keeping bedroom as dark as possible, daytime physical activity (especially resistance training), avoiding harmful bedtime habits (e.g. TV, phone use, etc),  avoiding oral intake within 1 hour of bed, avoiding evening caffeine/alcohol consumption.  Consider mindfulness exercises for sleep e.g. Calm app, yoga, etc.   3. Erectile dysfunction, unspecified erectile dysfunction type Continue tadalafil daily with sildenafil PRN as advised by urology  4. Prostate cancer managed with active surveillance [facility]) Continue specialist follow-up  5. Mixed hyperlipidemia Plan to check lipids next time along with other routine labs     Return in about 4 weeks (around 11/08/2023) for physical and  HTN f/u.    Alvester Morin, PA-C, DMSc, Nutritionist [facility] Care and Sports Medicine [facility] [phone]

## 2023-11-09 ENCOUNTER — Ambulatory Visit (INDEPENDENT_AMBULATORY_CARE_PROVIDER_SITE_OTHER): Payer: Medicare Other | Admitting: Physician Assistant

## 2023-11-09 ENCOUNTER — Encounter: Payer: Self-pay | Admitting: Physician Assistant

## 2023-11-09 VITALS — BP 128/82 | HR 69 | Ht 73.0 in | Wt 195.1 lb

## 2023-11-09 DIAGNOSIS — Z2821 Immunization not carried out because of patient refusal: Secondary | ICD-10-CM

## 2023-11-09 DIAGNOSIS — N5239 Other post-surgical erectile dysfunction: Secondary | ICD-10-CM

## 2023-11-09 DIAGNOSIS — Z0000 Encounter for general adult medical examination without abnormal findings: Secondary | ICD-10-CM

## 2023-11-09 DIAGNOSIS — I10 Essential (primary) hypertension: Secondary | ICD-10-CM

## 2023-11-09 DIAGNOSIS — E782 Mixed hyperlipidemia: Secondary | ICD-10-CM | POA: Diagnosis not present

## 2023-11-09 DIAGNOSIS — C61 Malignant neoplasm of prostate: Secondary | ICD-10-CM

## 2023-11-09 NOTE — Patient Instructions (Signed)

## 2023-11-09 NOTE — Addendum Note (Signed)
 Addended by: Remo Lipps on: 11/09/2023 09:08 AM   Modules accepted: Orders

## 2023-11-09 NOTE — Progress Notes (Addendum)
 Date:  11/09/2023   Name:  Shane Patterson   DOB:  06-Dec-1955   MRN:  [id number]   Chief Complaint: Annual Exam  HPI Lee presents today for routine physical exam. He has no specific complaints. Still has a minor cough, possibly allergy related.   Last Physical: 13 mo ago Last Dental Exam: 1y ago Last Eye Exam: ~4y ago Last CRC screen: 3-5y ago and normal Last PSA: Jul 2024 = 0.9 Immunizations Due: Shingrix, Tdap, Prevnar 20, RSV   Medication list has been reviewed and updated.  Current Meds  Medication Sig   fluticasone (FLONASE) 50 MCG/ACT nasal spray Place into both nostrils daily.   irbesartan (AVAPRO) 150 MG tablet Take 0.5 tablets (75 mg total) by mouth daily.   sildenafil (VIAGRA) 100 MG tablet Take 1 tablet (100 mg total) by mouth daily as needed for erectile dysfunction.   tadalafil (CIALIS) 5 MG tablet Take 1 tablet (5 mg total) by mouth daily.     Review of Systems  Patient Active Problem List   Diagnosis Date Noted   Prostate cancer managed with active surveillance (HCC) 10/11/2023   Hyperlipidemia 10/11/2023   Primary hypertension 10/11/2023   Sleeping difficulty 10/11/2023   History of arthroscopic procedure on shoulder 05/28/2023   Localized, primary osteoarthritis of shoulder region 04/01/2023   BPH with obstruction/lower urinary tract symptoms 10/06/2021   Elevated PSA 03/18/2014   Male erectile dysfunction, unspecified 07/18/2013    Allergies  Allergen Reactions   Flomax [Tamsulosin] Itching and Other (See Comments)    Nasal passages got blocked    Atorvastatin Itching   Cat Dander Cough and Itching   Lisinopril Cough    Immunization History  Administered Date(s) Administered   Influenza, High Dose Seasonal PF 08/24/2020   Influenza,inj,Quad PF,6+ Mos 07/27/2014, 07/28/2015, 08/02/2016, 07/29/2019   Influenza-Unspecified 07/27/2014, 07/28/2015, 08/02/2016   Pfizer Covid-19 Vaccine Bivalent Booster 68yrs & up 09/01/2021    Past Surgical  History:  Procedure Laterality Date   COLONOSCOPY     HOLEP-LASER ENUCLEATION OF THE PROSTATE WITH MORCELLATION N/A 11/27/2022   Procedure: HOLEP-LASER ENUCLEATION OF THE PROSTATE WITH MORCELLATION;  Surgeon: Vanna Scotland, MD;  Location: [facility] ORS;  Service: Urology;  Laterality: N/A;   SHOULDER SURGERY Right 10/2021    Social History   Tobacco Use   Smoking status: Never   Smokeless tobacco: Never  Vaping Use   Vaping status: Never Used  Substance Use Topics   Alcohol use: Yes    Comment: BEER orWINE occassionally   Drug use: No    Family History  Problem Relation Age of Onset   Stroke Mother    Diabetes Father    Diabetes Paternal Grandmother         11/09/2023    8:07 AM 10/11/2023    3:04 PM  GAD 7 : Generalized Anxiety Score  Nervous, Anxious, on Edge 0 0  Control/stop worrying 0 0  Worry too much - different things 0 0  Trouble relaxing 0 0  Restless 0 0  Easily annoyed or irritable 0 0  Afraid - awful might happen 0 0  Total GAD 7 Score 0 0  Anxiety Difficulty  Not difficult at all       11/09/2023    8:06 AM 10/11/2023    3:04 PM 07/28/2015    9:39 AM  Depression screen PHQ 2/9  Decreased Interest 0 0 0  Down, Depressed, Hopeless 0 0 0  PHQ - 2 Score 0 0 0  Altered sleeping 3    Tired, decreased energy 0    Change in appetite 0    Feeling bad or failure about yourself  0    Trouble concentrating 0    Moving slowly or fidgety/restless 0    Suicidal thoughts 0    PHQ-9 Score 3      BP Readings from Last 3 Encounters:  11/09/23 128/82  10/11/23 124/76  05/01/23 (!) 155/83    Wt Readings from Last 3 Encounters:  11/09/23 195 lb 2 oz (88.5 kg)  10/11/23 195 lb (88.5 kg)  12/04/22 195 lb (88.5 kg)    BP 128/82   Pulse 69   Ht 6\' 1"  (1.854 m)   Wt 195 lb 2 oz (88.5 kg)   SpO2 95%   BMI 25.74 kg/m   Physical Exam Vitals and nursing note reviewed.  Constitutional:      Appearance: Normal appearance.  HENT:     Ears:     Comments: EAC  clear bilaterally with good view of TM which is without effusion or erythema.     Nose: Nose normal.     Mouth/Throat:     Mouth: Mucous membranes are moist. No oral lesions.     Dentition: Normal dentition.     Pharynx: No posterior oropharyngeal erythema.  Eyes:     Extraocular Movements: Extraocular movements intact.     Conjunctiva/sclera: Conjunctivae normal.     Pupils: Pupils are equal, round, and reactive to light.  Neck:     Thyroid: No thyromegaly.  Cardiovascular:     Rate and Rhythm: Normal rate and regular rhythm.     Heart sounds: No murmur heard.    No friction rub. No gallop.     Comments: Pulses 2+ at radial, PT, DP bilaterally. No carotid bruit. No peripheral edema Pulmonary:     Effort: Pulmonary effort is normal.     Breath sounds: Normal breath sounds.  Abdominal:     General: Bowel sounds are normal.     Palpations: Abdomen is soft. There is no mass.     Tenderness: There is no abdominal tenderness.  Genitourinary:    Comments: Genital/rectal exam deferred.  Musculoskeletal:     Comments: Full ROM with strength 5/5 bilateral upper and lower extremities  Lymphadenopathy:     Cervical: No cervical adenopathy.  Skin:    General: Skin is warm.     Capillary Refill: Capillary refill takes less than 2 seconds.     Findings: No lesion or rash.  Neurological:     Mental Status: He is alert and oriented to person, place, and time.     Gait: Gait is intact.  Psychiatric:        Mood and Affect: Mood normal.        Behavior: Behavior normal.     Recent Labs     Component Value Date/Time   NA 139 10/27/2020 0653   NA 143 08/02/2016 1245   K 4.1 10/27/2020 0653   CL 103 10/27/2020 0653   CO2 23 10/27/2020 0653   GLUCOSE 107 (H) 10/27/2020 0653   BUN 17 10/27/2020 0653   BUN 11 08/02/2016 1245   CREATININE 0.85 10/27/2020 0653   CREATININE 0.79 07/28/2015 1039   CALCIUM 9.8 10/27/2020 0653   PROT 7.1 08/02/2016 1245   ALBUMIN 4.7 08/02/2016 1245    AST 34 08/02/2016 1245   ALT 36 08/02/2016 1245   ALKPHOS 114 08/02/2016 1245   BILITOT 0.4 08/02/2016 1245  GFRNONAA >60 10/27/2020 0653   GFRNONAA >89 07/27/2014 1615   GFRAA 108 08/02/2016 1245   GFRAA >89 07/27/2014 1615    Lab Results  Component Value Date   WBC 7.0 10/27/2020   HGB 15.7 10/27/2020   HCT 44.8 10/27/2020   MCV 92.2 10/27/2020   PLT 262 10/27/2020   Lab Results  Component Value Date   HGBA1C 5.6 08/02/2016   Lab Results  Component Value Date   CHOL 197 08/02/2016   HDL 35 (L) 08/02/2016   LDLCALC 134 (H) 08/02/2016   TRIG 140 08/02/2016   CHOLHDL 5.6 (H) 08/02/2016   Lab Results  Component Value Date   TSH 1.390 08/02/2016     Assessment and Plan:  1. Annual physical exam (Primary) Seemingly healthy patient with no abnormalities on exam. Encouraged healthy lifestyle including regular physical activity and consumption of whole fruits and vegetables. Encouraged routine dental and eye exams.   - CBC with Differential/Platelet - Comprehensive metabolic panel - TSH - Lipid panel  2. Primary hypertension Well-controlled, check routine labs - CBC with Differential/Platelet - Comprehensive metabolic panel - TSH  3. Mixed hyperlipidemia Check fasting lipids - Lipid panel  4. Prostate cancer managed with active surveillance [facility]) Dr. Apolinar Junes likely to repeat in 5-6 months but will go ahead and get today as well.  - PSA  5. Other post-procedural erectile dysfunction Patient would like to check testosterone. Even if low, he does not plan to use TRT but would like to know about it.  - Testosterone  Addendum: Testosterone was not likely to be covered per [facility], so order was cancelled.   6. Immunization declined Discussed he is due for Shingrix, Tdap, Prevnar 20, RSV. At this time he declines all. Might consider for future visit.      Return in about 6 months (around 05/11/2024) for OV f/u chronic conditions.    Alvester Morin, PA-C, DMSc,  Nutritionist [facility] Primary Care and Sports Medicine [facility] [phone]

## 2023-11-10 LAB — CBC WITH DIFFERENTIAL/PLATELET
Basophils Absolute: 0 10*3/uL (ref 0.0–0.2)
Basos: 1 %
EOS (ABSOLUTE): 0.3 10*3/uL (ref 0.0–0.4)
Eos: 5 %
Hematocrit: 41.7 % (ref 37.5–51.0)
Hemoglobin: 14.1 g/dL (ref 13.0–17.7)
Immature Grans (Abs): 0 10*3/uL (ref 0.0–0.1)
Immature Granulocytes: 0 %
Lymphocytes Absolute: 1.6 10*3/uL (ref 0.7–3.1)
Lymphs: 29 %
MCH: 31.8 pg (ref 26.6–33.0)
MCHC: 33.8 g/dL (ref 31.5–35.7)
MCV: 94 fL (ref 79–97)
Monocytes Absolute: 0.4 10*3/uL (ref 0.1–0.9)
Monocytes: 7 %
Neutrophils Absolute: 3.1 10*3/uL (ref 1.4–7.0)
Neutrophils: 58 %
Platelets: 254 10*3/uL (ref 150–450)
RBC: 4.43 x10E6/uL (ref 4.14–5.80)
RDW: 12.3 % (ref 11.6–15.4)
WBC: 5.4 10*3/uL (ref 3.4–10.8)

## 2023-11-10 LAB — TSH: TSH: 0.879 u[IU]/mL (ref 0.450–4.500)

## 2023-11-10 LAB — COMPREHENSIVE METABOLIC PANEL
ALT: 18 IU/L (ref 0–44)
AST: 24 IU/L (ref 0–40)
Albumin: 4.5 g/dL (ref 3.9–4.9)
Alkaline Phosphatase: 119 IU/L (ref 44–121)
BUN/Creatinine Ratio: 21 (ref 10–24)
BUN: 16 mg/dL (ref 8–27)
Bilirubin Total: 0.5 mg/dL (ref 0.0–1.2)
CO2: 24 mmol/L (ref 20–29)
Calcium: 9.2 mg/dL (ref 8.6–10.2)
Chloride: 105 mmol/L (ref 96–106)
Creatinine, Ser: 0.76 mg/dL (ref 0.76–1.27)
Globulin, Total: 2.1 g/dL (ref 1.5–4.5)
Glucose: 105 mg/dL — ABNORMAL HIGH (ref 70–99)
Potassium: 4.6 mmol/L (ref 3.5–5.2)
Sodium: 141 mmol/L (ref 134–144)
Total Protein: 6.6 g/dL (ref 6.0–8.5)
eGFR: 98 mL/min/{1.73_m2} (ref >59)

## 2023-11-10 LAB — LIPID PANEL
Chol/HDL Ratio: 5.7 ratio — ABNORMAL HIGH (ref 0.0–5.0)
Cholesterol, Total: 187 mg/dL (ref 100–199)
HDL: 33 mg/dL — ABNORMAL LOW (ref >39)
LDL Chol Calc (NIH): 138 mg/dL — ABNORMAL HIGH (ref 0–99)
Triglycerides: 84 mg/dL (ref 0–149)
VLDL Cholesterol Cal: 16 mg/dL (ref 5–40)

## 2023-11-10 LAB — PSA: Prostate Specific Ag, Serum: 1.3 ng/mL (ref 0.0–4.0)

## 2023-12-10 ENCOUNTER — Other Ambulatory Visit: Payer: Self-pay | Admitting: Physician Assistant

## 2023-12-10 DIAGNOSIS — E782 Mixed hyperlipidemia: Secondary | ICD-10-CM

## 2023-12-10 MED ORDER — ROSUVASTATIN CALCIUM 5 MG PO TABS
5.0000 mg | ORAL_TABLET | Freq: Every day | ORAL | 1 refills | Status: DC
Start: 2023-12-10 — End: 2024-02-06

## 2023-12-10 NOTE — Telephone Encounter (Signed)
 Please review.  KP

## 2024-01-17 ENCOUNTER — Ambulatory Visit: Admitting: Urology

## 2024-02-06 ENCOUNTER — Ambulatory Visit: Admitting: Urology

## 2024-02-06 VITALS — BP 165/90 | HR 73 | Ht 73.0 in | Wt 195.1 lb

## 2024-02-06 DIAGNOSIS — N138 Other obstructive and reflux uropathy: Secondary | ICD-10-CM

## 2024-02-06 DIAGNOSIS — C61 Malignant neoplasm of prostate: Secondary | ICD-10-CM

## 2024-02-06 DIAGNOSIS — N529 Male erectile dysfunction, unspecified: Secondary | ICD-10-CM | POA: Diagnosis not present

## 2024-02-06 DIAGNOSIS — N4340 Spermatocele of epididymis, unspecified: Secondary | ICD-10-CM

## 2024-02-06 DIAGNOSIS — N401 Enlarged prostate with lower urinary tract symptoms: Secondary | ICD-10-CM

## 2024-02-06 MED ORDER — SILDENAFIL CITRATE 100 MG PO TABS
100.0000 mg | ORAL_TABLET | Freq: Every day | ORAL | 2 refills | Status: AC | PRN
Start: 2024-02-06 — End: ?

## 2024-02-06 NOTE — Progress Notes (Addendum)
 [name],acting as a scribe for Shane Gimenez, MD.,have documented all relevant documentation on the behalf of Shane Gimenez, MD,as directed by  Shane Gimenez, MD while in the presence of Shane Gimenez, MD.  02/06/24 12:45 PM   Shane Patterson April 14, 1956 [id number]  Referring provider: Leopoldo Rancher, PA [address] [place],  [place] [address]  Chief Complaint  Patient presents with   discuss PSA result   testicular lump    HPI: 68 year old male with a personal history of elevated PSA, incidental prostate cancer, and BPH who presents today for follow-up.   He is status post HoLEP on 11/27/2022 for refractory urinary retention. He ultimately passed a voiding trial. Notably, he does have a personal history of BPH as well as elevated PSA. He was followed by Dr. Bosie Bye and then Dr. Willye Harvey. He did have a PI-RADS 3 lesion and underwent a fusion biopsy in 2023 that was negative for malignancy but was noted to have a 195 gram prostate. His pre-procedure PSA was 16.48.    Surgical pathology does show some incidental Gleason 3+3 prostate cancer involving 1% of the tissue examined.   His most recent PSA on 11/09/2023 was 1.3, which is significantly down from his previous baseline of 16.5.   He reports taking Cialis  5 mg daily for erectile function and has considered discontinuing it but notes potential additional benefits. He also uses Sildenafil  as needed, which he finds somewhat effective. He experiences variability in response, sometimes requiring a higher dose, but is cautious due to past headaches with higher doses. He notes that his erectile function improves during vacations, suggesting a possible psychological component.   He has a history of a large growth on his scrotum, which he discovered during self-examination. An ultrasound confirmed an epididymal cyst on the left side, and a similar growth on the right side is noted, which may be a fatty growth. He reports that  the size of the growths changes with sexual activity.   He expresses concern about the necessity of rectal exams, given his surgical history and stable PSA levels.   He also shares a personal story about his mother's medical care, expressing frustration with past medical experiences.  PMH: Past Medical History:  Diagnosis Date   Allergy    CATS and PLANTS   BPH (benign prostatic hyperplasia)    a.) on scheduled PDE5i (tadalafil )   Complication of anesthesia    a.) delayed emergence   ED (erectile dysfunction)    a.) on PDE5i (sildenafil ) PRN   Elevated PSA    Hypertension    LBBB (left bundle branch block) 11/23/2022   a.) noted on preoperative ECG; no priors for comparison; repeat same day tracings normal consistent with intermittent LBBB.   Pre-diabetes    Spermatocele     Surgical History: Past Surgical History:  Procedure Laterality Date   COLONOSCOPY     HOLEP-LASER ENUCLEATION OF THE PROSTATE WITH MORCELLATION N/A 11/27/2022   Procedure: HOLEP-LASER ENUCLEATION OF THE PROSTATE WITH MORCELLATION;  Surgeon: Shane Gimenez, MD;  Location: [facility] ORS;  Service: Urology;  Laterality: N/A;   SHOULDER SURGERY Right 10/2021    Home Medications:  Allergies as of 02/06/2024       Reactions   Flomax  [tamsulosin ] Itching, Other (See Comments)   Nasal passages got blocked    Atorvastatin  Itching   Cat Dander Cough, Itching   Lisinopril  Cough        Medication List  Accurate as of February 06, 2024 12:45 PM. If you have any questions, ask your nurse or doctor.          STOP taking these medications    rosuvastatin  5 MG tablet Commonly known as: CRESTOR        TAKE these medications    fluticasone  50 MCG/ACT nasal spray Commonly known as: FLONASE  Place into both nostrils daily.   irbesartan  150 MG tablet Commonly known as: AVAPRO  Take 0.5 tablets (75 mg total) by mouth daily.   sildenafil  100 MG tablet Commonly known as: VIAGRA  Take 1 tablet (100 mg  total) by mouth daily as needed for erectile dysfunction.   tadalafil  5 MG tablet Commonly known as: CIALIS  Take 1 tablet (5 mg total) by mouth daily.        Allergies:  Allergies  Allergen Reactions   Flomax  [Tamsulosin ] Itching and Other (See Comments)    Nasal passages got blocked    Atorvastatin  Itching   Cat Dander Cough and Itching   Lisinopril  Cough    Family History: Family History  Problem Relation Age of Onset   Stroke Mother    Diabetes Father    Diabetes Paternal Grandmother     Social History:  reports that he has never smoked. He has never used smokeless tobacco. He reports current alcohol use. He reports that he does not use drugs.   Physical Exam: BP (!) 165/90   Pulse 73   Ht 6' 1 (1.854 m)   Wt 195 lb 2 oz (88.5 kg)   BMI 25.74 kg/m   Constitutional:  Alert and oriented, No acute distress. HEENT: [place] AT, moist mucus membranes.  Trachea midline, no masses. GU: Approximately grape sized spermatocele on left testicle, slightly more rubbery cherry tomato sized cyst on right testicle Neurologic: Grossly intact, no focal deficits, moving all 4 extremities. Psychiatric: Normal mood and affect.   Assessment & Plan:    1. History of elevated PSA - His PSA has decreased significantly from a previous baseline of 16.5 to 1.3, which is a positive trend.  - The PSA will be monitored annually to ensure it does not rise precipitously. - No need for biopsies or rectal exams at this time unless PSA levels increase.  2. BPH with LUTS - Symptoms are well controlled.  - Continue monitoring PSA levels annually.  3. Erectile Dysfunction - He is on daily Cialis  5 mg and occasionally uses Sildenafil . He is advised to take Sildenafil  on an empty stomach and at least an hour before intercourse for optimal effect up to 100 mg. - Consideration of psychological factors and anxiety management is recommended, possibly through therapy or mindfulness techniques.  4.  Spermatocele - He has a new asymptomatic but enlarging spermatocele on the left side and a stable cyst on the right side(scrotal ultrasound confirmed 5/21.)  - Both are stable and not causing significant symptoms.  - No intervention is required unless they become symptomatic or increase in size.  Return in about 1 year (around 02/05/2025) for repeat PSA and reassessment of symptoms.  I have reviewed the above documentation for accuracy and completeness, and I agree with the above.   Shane Gimenez, MD    [facility] [address] [place], [place] [address] [phone]  I spent 32 total minutes on the day of the encounter including pre-visit review of the medical record, face-to-face time with the patient, and post visit ordering of labs/imaging/tests.

## 2024-03-05 ENCOUNTER — Ambulatory Visit

## 2024-03-05 ENCOUNTER — Telehealth: Payer: Self-pay | Admitting: Physician Assistant

## 2024-03-05 NOTE — Telephone Encounter (Signed)
 Copied from CRM [id number]. Topic: Medicare AWV >> Mar 05, 2024  8:47 AM Nathanel DEL wrote: Reason for CRM: Called LVM 03/05/2024 to schedule AWV on 03/05/24 due NHA out sick. Please call to r/s khc  Nathanel Paschal; Care Guide Ambulatory Clinical Support  l [facility] Direct Dial: [phone]

## 2024-03-11 DIAGNOSIS — M5416 Radiculopathy, lumbar region: Secondary | ICD-10-CM | POA: Diagnosis not present

## 2024-03-11 DIAGNOSIS — M62830 Muscle spasm of back: Secondary | ICD-10-CM | POA: Diagnosis not present

## 2024-03-11 DIAGNOSIS — M9903 Segmental and somatic dysfunction of lumbar region: Secondary | ICD-10-CM | POA: Diagnosis not present

## 2024-03-11 DIAGNOSIS — M9905 Segmental and somatic dysfunction of pelvic region: Secondary | ICD-10-CM | POA: Diagnosis not present

## 2024-03-12 ENCOUNTER — Ambulatory Visit

## 2024-03-14 DIAGNOSIS — M62830 Muscle spasm of back: Secondary | ICD-10-CM | POA: Diagnosis not present

## 2024-03-14 DIAGNOSIS — M9905 Segmental and somatic dysfunction of pelvic region: Secondary | ICD-10-CM | POA: Diagnosis not present

## 2024-03-14 DIAGNOSIS — M5416 Radiculopathy, lumbar region: Secondary | ICD-10-CM | POA: Diagnosis not present

## 2024-03-14 DIAGNOSIS — M9903 Segmental and somatic dysfunction of lumbar region: Secondary | ICD-10-CM | POA: Diagnosis not present

## 2024-03-17 DIAGNOSIS — M9905 Segmental and somatic dysfunction of pelvic region: Secondary | ICD-10-CM | POA: Diagnosis not present

## 2024-03-17 DIAGNOSIS — M5416 Radiculopathy, lumbar region: Secondary | ICD-10-CM | POA: Diagnosis not present

## 2024-03-17 DIAGNOSIS — M9903 Segmental and somatic dysfunction of lumbar region: Secondary | ICD-10-CM | POA: Diagnosis not present

## 2024-03-17 DIAGNOSIS — M62830 Muscle spasm of back: Secondary | ICD-10-CM | POA: Diagnosis not present

## 2024-04-25 ENCOUNTER — Other Ambulatory Visit: Payer: Self-pay

## 2024-04-30 ENCOUNTER — Ambulatory Visit: Payer: Self-pay | Admitting: Urology

## 2024-05-01 ENCOUNTER — Ambulatory Visit: Admitting: Physician Assistant

## 2024-06-11 ENCOUNTER — Ambulatory Visit

## 2024-06-11 DIAGNOSIS — Z0000 Encounter for general adult medical examination without abnormal findings: Secondary | ICD-10-CM

## 2024-06-11 NOTE — Progress Notes (Signed)
 Subjective:   Shane Patterson is a 68 y.o. who presents for a Medicare Wellness preventive visit.  As a reminder, Annual Wellness Visits don't include a physical exam, and some assessments may be limited, especially if this visit is performed virtually. We may recommend an in-person follow-up visit with your provider if needed.  Visit Complete: Virtual I connected with  Shane Patterson on 06/11/24 by a audio enabled telemedicine application and verified that I am speaking with the correct person using two identifiers.  Patient Location: Home  Provider Location: Office/Clinic  I discussed the limitations of evaluation and management by telemedicine. The patient expressed understanding and agreed to proceed.  Vital Signs: Because this visit was a virtual/telehealth visit, some criteria may be missing or patient reported. Any vitals not documented were not able to be obtained and vitals that have been documented are patient reported.  VideoDeclined- This patient declined Librarian, academic. Therefore the visit was completed with audio only.  Persons Participating in Visit: Patient.  AWV Questionnaire: No: Patient Medicare AWV questionnaire was not completed prior to this visit.  Cardiac Risk Factors include: advanced age (>41men, >58 women);dyslipidemia;hypertension;male gender     Objective:    There were no vitals filed for this visit. There is no height or weight on file to calculate BMI.     06/11/2024   11:03 AM 11/27/2022    6:25 AM 11/21/2022   12:44 PM  Advanced Directives  Does Patient Have a Medical Advance Directive? No Yes Yes  Type of Special educational needs teacher of [place];Living will Living will;Healthcare Power of Attorney  Would patient like information on creating a medical advance directive? No - Patient declined      Current Medications (verified) Outpatient Encounter Medications as of 06/11/2024  Medication Sig   irbesartan   (AVAPRO ) 150 MG tablet Take 0.5 tablets (75 mg total) by mouth daily.   loratadine (CLARITIN) 5 MG chewable tablet Chew 5 mg by mouth daily.   sildenafil  (VIAGRA ) 100 MG tablet Take 1 tablet (100 mg total) by mouth daily as needed for erectile dysfunction.   tadalafil  (CIALIS ) 5 MG tablet Take 1 tablet (5 mg total) by mouth daily.   fluticasone  (FLONASE ) 50 MCG/ACT nasal spray Place into both nostrils daily. (Patient not taking: Reported on 06/11/2024)   No facility-administered encounter medications on file as of 06/11/2024.    Allergies (verified) Flomax  [tamsulosin ], Atorvastatin , Cat dander, and Lisinopril    History: Past Medical History:  Diagnosis Date   Allergy    CATS and PLANTS   BPH (benign prostatic hyperplasia)    a.) on scheduled PDE5i (tadalafil )   Complication of anesthesia    a.) delayed emergence   ED (erectile dysfunction)    a.) on PDE5i (sildenafil ) PRN   Elevated PSA    Hypertension    LBBB (left bundle branch block) 11/23/2022   a.) noted on preoperative ECG; no priors for comparison; repeat same day tracings normal consistent with intermittent LBBB.   Pre-diabetes    Spermatocele    Past Surgical History:  Procedure Laterality Date   COLONOSCOPY     HOLEP-LASER ENUCLEATION OF THE PROSTATE WITH MORCELLATION N/A 11/27/2022   Procedure: HOLEP-LASER ENUCLEATION OF THE PROSTATE WITH MORCELLATION;  Surgeon: Penne Knee, MD;  Location: [facility] ORS;  Service: Urology;  Laterality: N/A;   SHOULDER SURGERY Right 10/2021   Family History  Problem Relation Age of Onset   Stroke Mother    Diabetes Father    Diabetes Paternal  Grandmother    Social History   Socioeconomic History   Marital status: Married    Spouse name: Not on file   Number of children: 3   Years of education: Not on file   Highest education level: Not on file  Occupational History   Occupation: CONTRACTOR  Tobacco Use   Smoking status: Never   Smokeless tobacco: Never  Vaping Use    Vaping status: Never Used  Substance and Sexual Activity   Alcohol use: Yes    Comment: BEER orWINE occassionally   Drug use: No   Sexual activity: Yes  Other Topics Concern   Not on file  Social History Narrative   Not on file   Social Drivers of Health   Financial Resource Strain: Low Risk  (06/11/2024)   Overall Financial Resource Strain (CARDIA)    Difficulty of Paying Living Expenses: Not hard at all  Food Insecurity: No Food Insecurity (06/11/2024)   Hunger Vital Sign    Worried About Running Out of Food in the Last Year: Never true    Ran Out of Food in the Last Year: Never true  Transportation Needs: No Transportation Needs (06/11/2024)   PRAPARE - Administrator, Civil Service (Medical): No    Lack of Transportation (Non-Medical): No  Physical Activity: Sufficiently Active (06/11/2024)   Exercise Vital Sign    Days of Exercise per Week: 3 days    Minutes of Exercise per Session: 60 min  Stress: No Stress Concern Present (06/11/2024)   [employer] of Occupational Health - Occupational Stress Questionnaire    Feeling of Stress: Only a little  Social Connections: Moderately Integrated (06/11/2024)   Social Connection and Isolation Panel    Frequency of Communication with Friends and Family: More than three times a week    Frequency of Social Gatherings with Friends and Family: More than three times a week    Attends Religious Services: More than 4 times per year    Active Member of [employer] or Organizations: No    Attends Engineer, structural: Never    Marital Status: Married    Tobacco Counseling Counseling given: Not Answered    Clinical Intake:  Pre-visit preparation completed: Yes  Pain : No/denies pain     BMI - recorded: 25.7 Nutritional Status: BMI 25 -29 Overweight Nutritional Risks: None Diabetes: No  Lab Results  Component Value Date   HGBA1C 5.6 08/02/2016     How often do you need to have someone help you when  you read instructions, pamphlets, or other written materials from your doctor or pharmacy?: 1 - Never  Interpreter Needed?: No  Information entered by :: Shane DAS, LPN   Activities of Daily Living     06/11/2024   11:04 AM  In your present state of health, do you have any difficulty performing the following activities:  Hearing? 0  Vision? 0  Difficulty concentrating or making decisions? 0  Walking or climbing stairs? 0  Dressing or bathing? 0  Doing errands, shopping? 0  Preparing Food and eating ? N  Using the Toilet? N  In the past six months, have you accidently leaked urine? N  Do you have problems with loss of bowel control? N  Managing your Medications? N  Managing your Finances? N  Housekeeping or managing your Housekeeping? N    Patient Care Team: Manya Toribio SQUIBB, PA as PCP - General (Physician Assistant)  I have updated your Care Teams any recent Medical  Services you may have received from other providers in the past year.     Assessment:   This is a routine wellness examination for Shane Patterson.  Hearing/Vision screen Hearing Screening - Comments:: NO AIDS Vision Screening - Comments:: READERS- NEEDS EYE MD, WIFE MAKING HIM AN APPT THIS WEEK   Goals Addressed             This Visit's Progress    DIET - EAT MORE FRUITS AND VEGETABLES         Depression Screen     06/11/2024   11:01 AM 11/09/2023    8:06 AM 10/11/2023    3:04 PM 07/28/2015    9:39 AM 07/27/2014    3:20 PM  PHQ 2/9 Scores  PHQ - 2 Score 0 0 0 0 0  PHQ- 9 Score 0 3       Fall Risk     06/11/2024   11:04 AM 11/09/2023    7:56 AM 10/11/2023    3:04 PM 07/28/2015    9:39 AM 07/27/2014    3:20 PM  Fall Risk   Falls in the past year? 0 0 0 No  No   Number falls in past yr: 0 0 0    Injury with Fall? 0 0 0    Risk for fall due to : No Fall Risks No Fall Risks No Fall Risks    Follow up Falls evaluation completed;Falls prevention discussed Falls evaluation completed Falls  evaluation completed       Data saved with a previous flowsheet row definition    MEDICARE RISK AT HOME:  Medicare Risk at Home Any stairs in or around the home?: Yes If so, are there any without handrails?: No Home free of loose throw rugs in walkways, pet beds, electrical cords, etc?: Yes Adequate lighting in your home to reduce risk of falls?: Yes Life alert?: No Use of a cane, walker or w/c?: No Grab bars in the bathroom?: No Shower chair or bench in shower?: No Elevated toilet seat or a handicapped toilet?: No  TIMED UP AND GO:  Was the test performed?  No  Cognitive Function: 6CIT completed        06/11/2024   11:06 AM  6CIT Screen  What Year? 0 points  What month? 0 points  What time? 0 points  Count back from 20 0 points  Months in reverse 0 points  Repeat phrase 0 points  Total Score 0 points    Immunizations Immunization History  Administered Date(s) Administered   INFLUENZA, HIGH DOSE SEASONAL PF 08/24/2020, 09/01/2021, 09/05/2022   Influenza,inj,Quad PF,6+ Mos 07/27/2014, 07/28/2015, 08/02/2016, 07/29/2019   Influenza-Unspecified 07/27/2014, 07/28/2015, 08/02/2016   Pfizer Covid-19 Vaccine Bivalent Booster 25yrs & up 09/01/2021    Screening Tests Health Maintenance  Topic Date Due   Hepatitis C Screening  Never done   DTaP/Tdap/Td (1 - Tdap) Never done   Zoster Vaccines- Shingrix (1 of 2) Never done   Colonoscopy  Never done   Pneumococcal Vaccine: 50+ Years (1 of 1 - PCV) Never done   COVID-19 Vaccine (2 - Pfizer risk series) 09/22/2021   Influenza Vaccine  03/28/2024   Medicare Annual Wellness (AWV)  06/11/2025   Meningococcal B Vaccine  Aged Out    Health Maintenance Items Addressed: NEEDS FLU, SHINGRIX, TDAP; HAD COLONOSCOPY 5 YEARS AGO IN [place] (PER PT)  Additional Screening:  Vision Screening: Recommended annual ophthalmology exams for early detection of glaucoma and other disorders of the eye. Is the  patient up to date with  their annual eye exam?  No  Who is the provider or what is the name of the office in which the patient attends annual eye exams? NO ONE- WIFE MAKING APPT  Dental Screening: Recommended annual dental exams for proper oral hygiene  Community Resource Referral / Chronic Care Management: CRR required this visit?  No   CCM required this visit?  No   Plan:    I have personally reviewed and noted the following in the patient's chart:   Medical and social history Use of alcohol, tobacco or illicit drugs  Current medications and supplements including opioid prescriptions. Patient is not currently taking opioid prescriptions. Functional ability and status Nutritional status Physical activity Advanced directives List of other physicians Hospitalizations, surgeries, and ER visits in previous 12 months Vitals Screenings to include cognitive, depression, and falls Referrals and appointments  In addition, I have reviewed and discussed with patient certain preventive protocols, quality metrics, and best practice recommendations. A written personalized care plan for preventive services as well as general preventive health recommendations were provided to patient.   Shane GORMAN Das, LPN   [id number]   After Visit Summary: (MyChart) Due to this being a telephonic visit, the after visit summary with patients personalized plan was offered to patient via MyChart   Notes: Nothing significant to report at this time.

## 2024-06-11 NOTE — Patient Instructions (Addendum)
 Shane Patterson,  Thank you for taking the time for your Medicare Wellness Visit. I appreciate your continued commitment to your health goals. Please review the care plan we discussed, and feel free to reach out if I can assist you further.  Medicare recommends these wellness visits once per year to help you and your care team stay ahead of potential health issues. These visits are designed to focus on prevention, allowing your provider to concentrate on managing your acute and chronic conditions during your regular appointments.  Please note that Annual Wellness Visits do not include a physical exam. Some assessments may be limited, especially if the visit was conducted virtually. If needed, we may recommend a separate in-person follow-up with your provider.  Ongoing Care Seeing your primary care provider every 3 to 6 months helps us  monitor your health and provide consistent, personalized care.   Referrals If a referral was made during today's visit and you haven't received any updates within two weeks, please contact the referred provider directly to check on the status.  Recommended Screenings:  Health Maintenance  Topic Date Due   Hepatitis C Screening  Never done   DTaP/Tdap/Td vaccine (1 - Tdap) Never done   Zoster (Shingles) Vaccine (1 of 2) Never done   Colon Cancer Screening  Never done   Pneumococcal Vaccine for age over 87 (1 of 1 - PCV) Never done   COVID-19 Vaccine (2 - Pfizer risk series) 09/22/2021   Flu Shot  03/28/2024   Medicare Annual Wellness Visit  06/11/2025   Meningitis B Vaccine  Aged Out     Advance Care Planning is important because it: Ensures you receive medical care that aligns with your values, goals, and preferences. Provides guidance to your family and loved ones, reducing the emotional burden of decision-making during critical moments.  Vision: Annual vision screenings are recommended for early detection of glaucoma, cataracts, and diabetic retinopathy.  These exams can also reveal signs of chronic conditions such as diabetes and high blood pressure.  Dental: Annual dental screenings help detect early signs of oral cancer, gum disease, and other conditions linked to overall health, including heart disease and diabetes.  Please see the attached documents for additional preventive care recommendations.   NEXT AWV 06/24/25 @ 10:50 AM BY PHONE

## 2024-06-20 NOTE — Progress Notes (Signed)
 Shane Patterson                                          MRN: [id number]   06/20/2024   The [facility] Quality Team Specialist reviewed this patient medical record for the purposes of chart review for care gap closure. The following were reviewed: chart review for care gap closure-controlling blood pressure.    [facility] Quality Team

## 2025-01-27 ENCOUNTER — Other Ambulatory Visit

## 2025-01-30 ENCOUNTER — Ambulatory Visit: Admitting: Urology

## 2025-06-24 ENCOUNTER — Ambulatory Visit
